# Patient Record
Sex: Male | Born: 1935 | Race: White | Hispanic: No | Marital: Married | Smoking: Former smoker
Health system: Southern US, Community
[De-identification: ages and names within clinical notes are randomized; demographics above are authoritative.]

## PROBLEM LIST (undated history)

## (undated) DIAGNOSIS — I4891 Unspecified atrial fibrillation: Secondary | ICD-10-CM

## (undated) DIAGNOSIS — G500 Trigeminal neuralgia: Secondary | ICD-10-CM

## (undated) DIAGNOSIS — I639 Cerebral infarction, unspecified: Secondary | ICD-10-CM

## (undated) DIAGNOSIS — F0390 Unspecified dementia without behavioral disturbance: Secondary | ICD-10-CM

## (undated) HISTORY — PX: CATARACT EXTRACTION, BILATERAL: SHX1313

---

## 1999-12-09 ENCOUNTER — Encounter: Admission: RE | Admit: 1999-12-09 | Discharge: 2000-03-08 | Payer: Self-pay | Admitting: Internal Medicine

## 2000-04-30 ENCOUNTER — Encounter: Admission: RE | Admit: 2000-04-30 | Discharge: 2000-07-29 | Payer: Self-pay | Admitting: Internal Medicine

## 2002-10-30 ENCOUNTER — Encounter: Admission: RE | Admit: 2002-10-30 | Discharge: 2003-01-28 | Payer: Self-pay | Admitting: Internal Medicine

## 2006-08-30 ENCOUNTER — Emergency Department (HOSPITAL_COMMUNITY): Admission: EM | Admit: 2006-08-30 | Discharge: 2006-08-30 | Payer: Self-pay | Admitting: Emergency Medicine

## 2006-09-06 ENCOUNTER — Emergency Department (HOSPITAL_COMMUNITY): Admission: EM | Admit: 2006-09-06 | Discharge: 2006-09-06 | Payer: Self-pay | Admitting: Emergency Medicine

## 2010-08-12 ENCOUNTER — Encounter
Admission: RE | Admit: 2010-08-12 | Discharge: 2010-11-10 | Payer: Self-pay | Source: Home / Self Care | Attending: Internal Medicine | Admitting: Internal Medicine

## 2010-08-15 ENCOUNTER — Encounter: Admission: RE | Admit: 2010-08-15 | Discharge: 2010-08-15 | Payer: Self-pay | Admitting: Internal Medicine

## 2011-05-15 ENCOUNTER — Inpatient Hospital Stay (HOSPITAL_COMMUNITY)
Admission: EM | Admit: 2011-05-15 | Discharge: 2011-05-18 | DRG: 066 | Disposition: A | Discharge status: Home / Self Care | Payer: Medicare Other | Attending: Internal Medicine | Admitting: Internal Medicine

## 2011-05-15 ENCOUNTER — Emergency Department (HOSPITAL_COMMUNITY): Payer: Medicare Other

## 2011-05-15 DIAGNOSIS — I10 Essential (primary) hypertension: Secondary | ICD-10-CM | POA: Diagnosis present

## 2011-05-15 DIAGNOSIS — E119 Type 2 diabetes mellitus without complications: Secondary | ICD-10-CM | POA: Diagnosis present

## 2011-05-15 DIAGNOSIS — I4891 Unspecified atrial fibrillation: Secondary | ICD-10-CM | POA: Diagnosis present

## 2011-05-15 DIAGNOSIS — Z79899 Other long term (current) drug therapy: Secondary | ICD-10-CM

## 2011-05-15 DIAGNOSIS — Z7901 Long term (current) use of anticoagulants: Secondary | ICD-10-CM

## 2011-05-15 DIAGNOSIS — I6350 Cerebral infarction due to unspecified occlusion or stenosis of unspecified cerebral artery: Principal | ICD-10-CM | POA: Diagnosis present

## 2011-05-15 DIAGNOSIS — F0390 Unspecified dementia without behavioral disturbance: Secondary | ICD-10-CM | POA: Diagnosis present

## 2011-05-15 DIAGNOSIS — G8310 Monoplegia of lower limb affecting unspecified side: Secondary | ICD-10-CM | POA: Diagnosis present

## 2011-05-15 DIAGNOSIS — E785 Hyperlipidemia, unspecified: Secondary | ICD-10-CM | POA: Diagnosis present

## 2011-05-15 DIAGNOSIS — R791 Abnormal coagulation profile: Secondary | ICD-10-CM | POA: Diagnosis present

## 2011-05-15 DIAGNOSIS — M1990 Unspecified osteoarthritis, unspecified site: Secondary | ICD-10-CM | POA: Diagnosis present

## 2011-05-15 DIAGNOSIS — R209 Unspecified disturbances of skin sensation: Secondary | ICD-10-CM | POA: Diagnosis present

## 2011-05-15 LAB — POCT I-STAT, CHEM 8
BUN: 35 mg/dL — ABNORMAL HIGH (ref 6–23)
Chloride: 104 mEq/L (ref 96–112)
HCT: 37 % — ABNORMAL LOW (ref 39.0–52.0)
Potassium: 4 mEq/L (ref 3.5–5.1)

## 2011-05-15 LAB — CBC
HCT: 34.2 % — ABNORMAL LOW (ref 39.0–52.0)
Hemoglobin: 11.5 g/dL — ABNORMAL LOW (ref 13.0–17.0)
MCV: 93.2 fL (ref 78.0–100.0)
RBC: 3.67 MIL/uL — ABNORMAL LOW (ref 4.22–5.81)
WBC: 5.4 10*3/uL (ref 4.0–10.5)

## 2011-05-15 LAB — DIFFERENTIAL
Lymphocytes Relative: 28 % (ref 12–46)
Lymphs Abs: 1.5 10*3/uL (ref 0.7–4.0)
Monocytes Relative: 10 % (ref 3–12)
Neutrophils Relative %: 54 % (ref 43–77)

## 2011-05-15 LAB — APTT: aPTT: 34 seconds (ref 24–37)

## 2011-05-15 LAB — PROTIME-INR: INR: 1.76 — ABNORMAL HIGH (ref 0.00–1.49)

## 2011-05-16 ENCOUNTER — Inpatient Hospital Stay (HOSPITAL_COMMUNITY): Payer: Medicare Other

## 2011-05-16 LAB — COMPREHENSIVE METABOLIC PANEL
Albumin: 3.2 g/dL — ABNORMAL LOW (ref 3.5–5.2)
BUN: 33 mg/dL — ABNORMAL HIGH (ref 6–23)
Calcium: 9 mg/dL (ref 8.4–10.5)
Chloride: 102 mEq/L (ref 96–112)
Creatinine, Ser: 1.29 mg/dL (ref 0.50–1.35)
Total Bilirubin: 0.3 mg/dL (ref 0.3–1.2)
Total Protein: 6.4 g/dL (ref 6.0–8.3)

## 2011-05-16 LAB — CK TOTAL AND CKMB (NOT AT ARMC)
CK, MB: 3.6 ng/mL (ref 0.3–4.0)
Relative Index: INVALID (ref 0.0–2.5)
Total CK: 68 U/L (ref 7–232)

## 2011-05-16 LAB — GLUCOSE, CAPILLARY
Glucose-Capillary: 64 mg/dL — ABNORMAL LOW (ref 70–99)
Glucose-Capillary: 220 mg/dL — ABNORMAL HIGH (ref 70–99)
Glucose-Capillary: 76 mg/dL (ref 70–99)

## 2011-05-16 LAB — PROTIME-INR: INR: 1.86 — ABNORMAL HIGH (ref 0.00–1.49)

## 2011-05-17 DIAGNOSIS — G459 Transient cerebral ischemic attack, unspecified: Secondary | ICD-10-CM

## 2011-05-17 LAB — HEPARIN LEVEL (UNFRACTIONATED): Heparin Unfractionated: 0.44 IU/mL (ref 0.30–0.70)

## 2011-05-17 LAB — PROTIME-INR: Prothrombin Time: 22.7 seconds — ABNORMAL HIGH (ref 11.6–15.2)

## 2011-05-17 LAB — GLUCOSE, CAPILLARY
Glucose-Capillary: 87 mg/dL (ref 70–99)
Glucose-Capillary: 133 mg/dL — ABNORMAL HIGH (ref 70–99)

## 2011-05-17 LAB — CBC
Hemoglobin: 11.2 g/dL — ABNORMAL LOW (ref 13.0–17.0)
MCH: 31.5 pg (ref 26.0–34.0)
RBC: 3.56 MIL/uL — ABNORMAL LOW (ref 4.22–5.81)

## 2011-05-17 NOTE — H&P (Signed)
Martin Mueller, Martin Mueller                   ACCOUNT NO.:  [id number]  MEDICAL RECORD NO.:  [id number]  LOCATION:  3702                         FACILITY:  [facility]  PHYSICIAN:  Geoffry Paradise, M.D.  DATE OF BIRTH:  [birth year]  DATE OF ADMISSION:  05/16/2011 DATE OF DISCHARGE:                             HISTORY & PHYSICAL   CHIEF COMPLAINT:  Right-sided numbness, tingling, and questionable right lower extremity weakness.  HISTORY OF PRESENT ILLNESS:  Martin Mueller is an 77 year old gentleman well- known to myself with a history of chronic atrial fibrillation on Coumadin, diabetes mellitus type 2 on oral agents, hyperlipidemia, hypertension, and memory loss presenting at this time with onset of right-sided symptoms.  He evidently had naps through the early evening hours and when he awakens, told the family that he was having difficulty with the right side of his body to include his right thorax, right arm, and right lower extremity.  This was primarily paresthesias, numbness, and tingling, but also did include some clumsiness and questionable weakness involving the right lower extremity.  He was having some difficulty with his gait and brought to the emergency room where most of this gradually cleared with some questionable residual right lower extremity symptoms.  He was seen initially by the code stroking who declined admission, left the consult, and he was admitted to our service for further management.  Initial CT scan of the brain was negative for acute bleed or events and vital signs were stable.  At the time of my assessment this morning, he is almost symptom-free, a bit more confused now that he is in the hospital.  He denies any chest pain or shortness of breath.  Denies any headache or nausea.  Denies any fever, chills, or sweats.  PAST MEDICAL HISTORY:  The patient is intolerant to Macrobid.  MEDICATIONS: 1. Coumadin 5 mg one daily. 2. Digoxin 0.25 one-half daily. 3. Glucotrol XL 5  mg b.i.d. 4. Multivitamin one daily. 5. Simvastatin 40 mg one-half tablet daily. 6. Namenda 10 mg p.o. b.i.d. 7. Aricept 10 mg daily. 8. Artificial tears p.r.n.  PAST MEDICAL HISTORY: 1. Chronic atrial fibrillation. 2. Benign prostatic hypertrophy. 3. Diabetes mellitus, type 2. 4. Hematuria, microscopic. 5. Hyperlipidemia. 6. Essential hypertension. 7. Osteoarthritis. 8. Pancreatic cyst by CT in 2002, 8 mm renal cyst 3 cm by CT in 2002. 9. Memory loss MMSE 21/30 in August 2010; MMSE 22/30 in April 2010.  SURGICAL ILLNESSES:  Transurethral resection of the prostate in December 1995 and vasectomy in 1970.  FAMILY HISTORY:  Noncontributory.  SOCIAL HISTORY:  He is married, has three children, does not smoke.  He is retired.  PHYSICAL EXAM:  VITAL SIGNS:  Temperature was 98, blood pressure 110/70, pulse was 70 and regular, respiratory rate 18, O2 saturation 94%. GENERAL:  The patient was awake, alert, good facial symmetry, a bit confused but clearly knows that he is in the hospital, clearly knows me, a bit confused on timing and circumstances. HEENT:  Anicteric.  Visual fields intact.  No gaze preference. Oropharynx benign.  Tongue midline. NECK:  Supple.  No JVD or bruits. LUNGS:  Clear. CARDIOVASCULAR:  Irregularly irregular rhythm 70s, 2/6 systolic  ejection murmur. BREASTS:  Negative. ABDOMEN:  Soft and nontender.  No hepatosplenomegaly. EXTREMITIES:  No cyanosis, clubbing, or edema.  Intact distal pulses. Joints normal. NEUROLOGIC:  High cortical functioning intact.  Remote recall diminished.  Cranial nerves intact.  Strength is 5/5 throughout. Sensation is grossly intact.  ASSESSMENT: 1. Right-sided sensory symptoms questionable, right leg weakness,     unclear left brain transient ischemic attack versus cerebrovascular     accident. 2. Chronic atrial fibrillation, on Coumadin, subtherapeutic, we will     require heparin bridge while we adjust it. 3. Memory loss,  on Namenda and Aricept. 4. Diabetes mellitus, type 2, controlled. 5. Hyperlipidemia. 6. Essential hypertension.  PLAN:  The patient will be admitted pending her carotid Dopplers, echo, and MRI.  Coumadin will be readjusted and aspirin added as well.          ______________________________ Geoffry Paradise, M.D.     RA/MEDQ  D:  05/16/2011  T:  05/17/2011  Job:  [id number]  Electronically Signed by Geoffry Paradise M.D. on 05/17/2011 04:20:52 PM

## 2011-05-18 LAB — CBC
HCT: 34.8 % — ABNORMAL LOW (ref 39.0–52.0)
Hemoglobin: 11.8 g/dL — ABNORMAL LOW (ref 13.0–17.0)
MCH: 31.4 pg (ref 26.0–34.0)
MCHC: 33.9 g/dL (ref 30.0–36.0)
MCV: 92.6 fL (ref 78.0–100.0)
RBC: 3.76 MIL/uL — ABNORMAL LOW (ref 4.22–5.81)

## 2011-05-18 LAB — LIPID PANEL
Cholesterol: 115 mg/dL (ref 0–200)
Triglycerides: 49 mg/dL (ref <150)
VLDL: 10 mg/dL (ref 0–40)

## 2011-05-18 LAB — CARDIAC PANEL(CRET KIN+CKTOT+MB+TROPI)
CK, MB: 3.6 ng/mL (ref 0.3–4.0)
Relative Index: INVALID (ref 0.0–2.5)
Total CK: 72 U/L (ref 7–232)
Troponin I: <0.3 ng/mL (ref <0.30)

## 2011-05-24 NOTE — Discharge Summary (Signed)
Martin Mueller, Martin Mueller                   ACCOUNT NO.:  [id number]  MEDICAL RECORD NO.:  [id number]  LOCATION:  3702                         FACILITY:  [facility]  PHYSICIAN:  Geoffry Paradise, M.D.  DATE OF BIRTH:  [birth year]  DATE OF ADMISSION:  05/15/2011 DATE OF DISCHARGE:  05/18/2011                              DISCHARGE SUMMARY   DIAGNOSES AT THE TIME OF DISCHARGE: 1. Acute lacunar infarct/cerebrovascular accident left corona radiata     with right-sided sensory symptoms and right lower extremity     paresis. 2. Chronic atrial fibrillation on Coumadin subtherapeutic. 3. Diabetes mellitus type 2 controlled with oral agents. 4. Hyperlipidemia. 5. Memory loss. 6. Osteoarthritis.  HISTORY OF PRESENT ILLNESS:  Mr. Laspina is a very pleasant 77 year old gentleman well-known to myself with chronic AFib on Coumadin, diabetes mellitus type 2 on oral agents, hyperlipidemia and memory loss presenting at this time with sudden onset of new right-sided sensory symptoms and a clumsy right lower extremity.  He evidently awoke from a nap this way and was seen in the emergency room where Code Stroke was called but no intervention planned.  His INR was subtherapeutic at that time and he was referred to the Primary Service for admission after Neurology declined admission and relinquished himself to just consultative status only.  For details see the dictated history and physical exam May 16, 2011.  DATA:  MRI acute lacunar infarct left lateral __________ white matter, no mass effect or hemorrhage, otherwise, no acute disease.  CT scan of the brain atrophy, no acute abnormalities.  Carotid Dopplers no internal carotid disease, vertebral flow antegrade.  CBC hemoglobin 11.5, hematocrit 34.2, white blood cell count is 5.4, platelet count is 140,000.  Chemistry sodium 137, potassium 4.1, chloride 102, CO2 26, glucose 208, BUN 33, creatinine 1.2.  Liver function testing bilirubin 0.3, alk phos 41, SGOT 18,  SGPT 14, protein is 6.4, albumin 3.2, calcium 9, CK 68, MB 3.6, troponin less than 0.3.  May 18, 2011, INR is 1.57. May 17, 2011, INR is 1.96.  May 16, 2011, INR is 1.86.  Pending at this time is echocardiogram.  HOSPITAL COURSE:  The patient was admitted, hydrated.  OT/PT consulted. Symptoms gradually resolved.  He is steady gait wise with a cane and to this extent this was the status as well upon admission.  He was placed on a heparin bridge with Coumadin, aspirin added to the regimen as well. It remains unclear as to whether this was a small embolic event or purely a cortical occlusive event but both covered well with a combination of Coumadin and aspirin.  He is clearly back to baseline with minimal confusion but awake, alert, acknowledges myself at [facility] and circumstances.  He did had some confusion earlier on in the course.  He has been stable from a cardiopulmonary standpoint with stable blood pressure and heart rate in the 80s to 90s.  His CBGs have been well-controlled as well.  The patient is discharged in improved and stable condition.  DISCHARGE MEDICATIONS: 1. Aspirin 325 daily. 2. Digoxin 0.25 daily. 3. Donepezil 10 mg daily. 4. Glipizide 5 b.i.d. 5. Multivitamin 1 daily. 6. Namenda 10  b.i.d. 7. Warfarin, this has been changed to 1 mg alternating with 2 mg     daily. 8. Simvastatin 20 mg daily.  He will follow up this week with Dr. Lanell Matar office for a pro time and followup.  I have discussed all circumstances with wife, Reita Cliche as well. He is on a carb- modified diet and safety has been reassessed.          ______________________________ Geoffry Paradise, M.D.     RA/MEDQ  D:  05/18/2011  T:  05/18/2011  Job:  [id number]  Electronically Signed by Geoffry Paradise M.D. on 05/24/2011 10:00:01 PM

## 2011-06-10 NOTE — Consult Note (Signed)
Martin Mueller, Martin Mueller                   ACCOUNT NO.:  [id number]  MEDICAL RECORD NO.:  [id number]  LOCATION:  [facility]                         FACILITY:  [facility]  PHYSICIAN:  Joycelyn Schmid, MD   DATE OF BIRTH:  1921/07/08  DATE OF CONSULTATION:  05/15/2011 DATE OF DISCHARGE:                                CONSULTATION   TIME:  11:30 p.m.  CHIEF COMPLAINT:  Right-sided right arm and leg numbness and weakness.  HISTORY OF PRESENT ILLNESS:  This is an 77 year old right-handed male with history of diabetes, atrial fibrillation, dementia who was in normal state of health, had been sleeping on and off throughout the day, last went to sleep at 6 o'clock p.m., when he woke up at around 8 o'clock p.m., he went to take a shower and noticed right leg heaviness and weakness.  Within half an hour, his right arm felt numb.  The patient's wife was with him and called 911, the patient was brought to [facility] for code stroke evaluation.  Upon arrival, the patient's symptoms were rapidly improving.  His weakness had resolved. He only had minimal subjective paresthesias in the right leg, therefore, code stroke was cancelled.  Also, the patient was later found to be on Coumadin.  Presently, the patient feels that his weakness has significantly improved.  He still feels some intermittent numbness in his right leg.  PAST MEDICAL HISTORY: 1. Diabetes. 2. Atrial fibrillation. 3. Dementia.  MEDICATIONS:  Coumadin, digoxin, donepezil, Namenda, simvastatin, glipizide.  ALLERGIES:  No known drug allergies.  FAMILY HISTORY:  None.  SOCIAL HISTORY:  He lives with his wife.  He quit tobacco use years ago. No alcohol use.  No illicit drug use.  REVIEW OF SYSTEMS:  As per HPI.  Denies recent fevers, chills, chest pain, shortness of breath, muscle pain, nausea, vomiting, abdominal pain.  Full 14-system review is otherwise negative.  PHYSICAL EXAMINATION:  VITAL SIGNS:  Blood pressure 133/59,  heart rate 74 and irregular, respirations 18, O2 sat 99% on 2 liters nasal cannula. GENERAL:  Awake, alert, language is fluent, comprehension intact.  He is somewhat confused about the date and time of day.  He has good insight into his dementia and memory difficulties.  No aphasia. NEUROLOGIC:  Cranial nerve examination:  Pupils reactive from 2-1 mm. Visual fields full to confrontation.  Extraocular muscles intact. Facial sensation and strength are symmetric.  Uvula is midline. Shoulder shrug is symmetric.  Tongue is midline. Motor examination:  Normal bulk and tone, 5/5 strength in the bilateral upper and lower extremities. Sensory examination is intact to light touch and pinprick.  Absent vibration sensation in his left toe, ankle, and left knee.  Reflexes 1+ at the upper extremities and knees, absent at the ankles.  Downgoing toes.  Finger-nose-finger smooth and symmetric. CARDIOVASCULAR:  Irregularly irregular.  No murmurs.  No carotid bruits.  LABORATORY TESTING:  White count 5.4, platelets 140.  CBG 251.  Sodium 138, potassium 4, BUN 35, creatinine 1.4.  CT scan of the head which I reviewed shows moderate biparietal and bitemporal atrophy, mild chronic small vessel ischemic disease.  IMPRESSION:  This is an 77 year old male with  diabetes, atrial fibrillation, and dementia with right arm and leg numbness and right leg weakness.  Symptoms are rapidly improving and essentially resolved. Differential diagnosis transient ischemic attack versus small left brain subcortical ischemic infarction.  RECOMMENDATIONS: 1. Followup lab testing, continue Coumadin. 2. MRI of the brain. 3. Consider carotid ultrasound.  Although given the patient's age and     dementia, he likely is a poor surgical candidate. 4. Diabetes control. 5. Permissive hypertension for 24 hours and then gradually reduce     blood pressure to normotensive over the next several days. 6. Check fasting lipid profile. 7.  PT evaluation. 8. N.p.o. until swallow evaluation. 9. I reviewed my findings with the patient, his wife, and his son who     were at the bedside.     Joycelyn Schmid, MD     VP/MEDQ  D:  05/15/2011  T:  05/16/2011  Job:  [id number]  Electronically Signed by Joycelyn Schmid  on 06/10/2011 10:21:05 PM

## 2011-08-31 ENCOUNTER — Encounter (HOSPITAL_COMMUNITY): Payer: Self-pay | Admitting: Radiology

## 2011-08-31 ENCOUNTER — Emergency Department (HOSPITAL_COMMUNITY)
Admission: EM | Admit: 2011-08-31 | Discharge: 2011-08-31 | Disposition: A | Discharge status: Home / Self Care | Payer: Medicare Other | Attending: Emergency Medicine | Admitting: Emergency Medicine

## 2011-08-31 ENCOUNTER — Emergency Department (HOSPITAL_COMMUNITY): Payer: Medicare Other

## 2011-08-31 DIAGNOSIS — I4891 Unspecified atrial fibrillation: Secondary | ICD-10-CM | POA: Insufficient documentation

## 2011-08-31 DIAGNOSIS — F0390 Unspecified dementia without behavioral disturbance: Secondary | ICD-10-CM | POA: Insufficient documentation

## 2011-08-31 DIAGNOSIS — E119 Type 2 diabetes mellitus without complications: Secondary | ICD-10-CM | POA: Insufficient documentation

## 2011-08-31 DIAGNOSIS — Y92009 Unspecified place in unspecified non-institutional (private) residence as the place of occurrence of the external cause: Secondary | ICD-10-CM | POA: Insufficient documentation

## 2011-08-31 DIAGNOSIS — Z8679 Personal history of other diseases of the circulatory system: Secondary | ICD-10-CM | POA: Insufficient documentation

## 2011-08-31 DIAGNOSIS — W010XXA Fall on same level from slipping, tripping and stumbling without subsequent striking against object, initial encounter: Secondary | ICD-10-CM | POA: Insufficient documentation

## 2011-08-31 DIAGNOSIS — S0180XA Unspecified open wound of other part of head, initial encounter: Secondary | ICD-10-CM | POA: Insufficient documentation

## 2011-08-31 DIAGNOSIS — IMO0002 Reserved for concepts with insufficient information to code with codable children: Secondary | ICD-10-CM | POA: Insufficient documentation

## 2011-08-31 HISTORY — DX: Cerebral infarction, unspecified: I63.9

## 2011-08-31 HISTORY — DX: Unspecified atrial fibrillation: I48.91

## 2011-08-31 HISTORY — DX: Unspecified dementia, unspecified severity, without behavioral disturbance, psychotic disturbance, mood disturbance, and anxiety: F03.90

## 2011-08-31 LAB — PROTIME-INR
INR: 2.53 — ABNORMAL HIGH (ref 0.00–1.49)
Prothrombin Time: 27.7 seconds — ABNORMAL HIGH (ref 11.6–15.2)

## 2011-08-31 LAB — BASIC METABOLIC PANEL
CO2: 32 mEq/L (ref 19–32)
Chloride: 102 mEq/L (ref 96–112)
GFR calc Af Amer: 62 mL/min — ABNORMAL LOW (ref >90)
Potassium: 4.6 mEq/L (ref 3.5–5.1)

## 2011-08-31 LAB — DIFFERENTIAL
Basophils Absolute: 0 10*3/uL (ref 0.0–0.1)
Basophils Relative: 1 % (ref 0–1)
Lymphocytes Relative: 22 % (ref 12–46)
Monocytes Absolute: 0.6 10*3/uL (ref 0.1–1.0)
Monocytes Relative: 10 % (ref 3–12)
Neutro Abs: 3.9 10*3/uL (ref 1.7–7.7)
Neutrophils Relative %: 64 % (ref 43–77)

## 2011-08-31 LAB — CBC
HCT: 37.5 % — ABNORMAL LOW (ref 39.0–52.0)
Hemoglobin: 12.5 g/dL — ABNORMAL LOW (ref 13.0–17.0)
MCH: 31.3 pg (ref 26.0–34.0)
MCHC: 33.3 g/dL (ref 30.0–36.0)
RBC: 3.99 MIL/uL — ABNORMAL LOW (ref 4.22–5.81)

## 2011-08-31 LAB — DIGOXIN LEVEL: Digoxin Level: 1.2 ng/mL (ref 0.8–2.0)

## 2013-05-03 ENCOUNTER — Emergency Department (HOSPITAL_COMMUNITY): Payer: Medicare Other

## 2013-05-03 ENCOUNTER — Emergency Department (HOSPITAL_COMMUNITY)
Admission: EM | Admit: 2013-05-03 | Discharge: 2013-05-03 | Disposition: A | Discharge status: Home / Self Care | Payer: Medicare Other | Attending: Emergency Medicine | Admitting: Emergency Medicine

## 2013-05-03 ENCOUNTER — Encounter (HOSPITAL_COMMUNITY): Payer: Self-pay | Admitting: Emergency Medicine

## 2013-05-03 DIAGNOSIS — E119 Type 2 diabetes mellitus without complications: Secondary | ICD-10-CM | POA: Insufficient documentation

## 2013-05-03 DIAGNOSIS — R5383 Other fatigue: Secondary | ICD-10-CM | POA: Insufficient documentation

## 2013-05-03 DIAGNOSIS — F0390 Unspecified dementia without behavioral disturbance: Secondary | ICD-10-CM | POA: Insufficient documentation

## 2013-05-03 DIAGNOSIS — R5381 Other malaise: Secondary | ICD-10-CM | POA: Insufficient documentation

## 2013-05-03 DIAGNOSIS — Z7901 Long term (current) use of anticoagulants: Secondary | ICD-10-CM | POA: Insufficient documentation

## 2013-05-03 DIAGNOSIS — R05 Cough: Secondary | ICD-10-CM | POA: Insufficient documentation

## 2013-05-03 DIAGNOSIS — Z8673 Personal history of transient ischemic attack (TIA), and cerebral infarction without residual deficits: Secondary | ICD-10-CM | POA: Insufficient documentation

## 2013-05-03 DIAGNOSIS — Z8669 Personal history of other diseases of the nervous system and sense organs: Secondary | ICD-10-CM | POA: Insufficient documentation

## 2013-05-03 DIAGNOSIS — I4891 Unspecified atrial fibrillation: Secondary | ICD-10-CM | POA: Insufficient documentation

## 2013-05-03 DIAGNOSIS — R531 Weakness: Secondary | ICD-10-CM

## 2013-05-03 DIAGNOSIS — R059 Cough, unspecified: Secondary | ICD-10-CM | POA: Insufficient documentation

## 2013-05-03 DIAGNOSIS — Z79899 Other long term (current) drug therapy: Secondary | ICD-10-CM | POA: Insufficient documentation

## 2013-05-03 HISTORY — DX: Trigeminal neuralgia: G50.0

## 2013-05-03 LAB — COMPREHENSIVE METABOLIC PANEL
Albumin: 3 g/dL — ABNORMAL LOW (ref 3.5–5.2)
Alkaline Phosphatase: 59 U/L (ref 39–117)
BUN: 14 mg/dL (ref 6–23)
CO2: 27 mEq/L (ref 19–32)
Chloride: 99 mEq/L (ref 96–112)
GFR calc Af Amer: 62 mL/min — ABNORMAL LOW (ref >90)
GFR calc non Af Amer: 54 mL/min — ABNORMAL LOW (ref >90)
Glucose, Bld: 101 mg/dL — ABNORMAL HIGH (ref 70–99)
Potassium: 4 mEq/L (ref 3.5–5.1)
Total Bilirubin: 0.4 mg/dL (ref 0.3–1.2)

## 2013-05-03 LAB — CBC WITH DIFFERENTIAL/PLATELET
HCT: 33.1 % — ABNORMAL LOW (ref 39.0–52.0)
Hemoglobin: 10.8 g/dL — ABNORMAL LOW (ref 13.0–17.0)
Lymphocytes Relative: 14 % (ref 12–46)
Lymphs Abs: 0.8 10*3/uL (ref 0.7–4.0)
Monocytes Absolute: 0.5 10*3/uL (ref 0.1–1.0)
Monocytes Relative: 9 % (ref 3–12)
Neutro Abs: 4.2 10*3/uL (ref 1.7–7.7)
Neutrophils Relative %: 76 % (ref 43–77)
RBC: 4.15 MIL/uL — ABNORMAL LOW (ref 4.22–5.81)

## 2013-05-03 LAB — URINALYSIS, ROUTINE W REFLEX MICROSCOPIC
Bilirubin Urine: NEGATIVE
Ketones, ur: NEGATIVE mg/dL
Nitrite: NEGATIVE
Protein, ur: NEGATIVE mg/dL
pH: 6.5 (ref 5.0–8.0)

## 2013-05-03 LAB — DIGOXIN LEVEL: Digoxin Level: 0.7 ng/mL — ABNORMAL LOW (ref 0.8–2.0)

## 2013-05-03 LAB — URINE MICROSCOPIC-ADD ON

## 2013-05-03 LAB — TROPONIN I: Troponin I: <0.3 ng/mL (ref <0.30)

## 2013-05-03 MED ORDER — SODIUM CHLORIDE 0.9 % IV BOLUS (SEPSIS)
500.0000 mL | Freq: Once | INTRAVENOUS | Status: AC
  Administered 2013-05-03: 500 mL via INTRAVENOUS

## 2013-05-03 NOTE — ED Notes (Signed)
Returned from ct 

## 2013-05-03 NOTE — ED Notes (Signed)
Attempted to in and out cath pt, unable to pass catheter. Dr. Ranae Palms made aware.

## 2013-05-03 NOTE — Progress Notes (Signed)
Spoke with Baxter Hire, [facility] Coordinator via text after wife informed Amy ED CM that [facility] is her [facility] for home health agency Pending orders from EDP, Freida Busman.  Kristen replied at 1701 that text received and pt to be followed

## 2013-05-03 NOTE — Progress Notes (Signed)
05/03/2013 1655pm A. German Manke RNCM [facility] spoke to patient's wife at bedside who confirmed that she would like [facility] to follow her husband at home.  Explained to patient's wife that her husband would receive nursing services, physical and occupational therapy, a Agricultural engineer and a Child psychotherapist at home. Patient's wife veralized understanding and is thankful for services.

## 2013-05-03 NOTE — ED Notes (Signed)
Informed by secretary that pts PCP will be admitting him, Admitting MD is aware that pt is in ED and will be to see pt when he can. Pt to remain in ED until seen by Admitting MD. Pt and family notified.

## 2013-05-03 NOTE — ED Notes (Signed)
Pt Unable to void at this time.

## 2013-05-03 NOTE — Progress Notes (Signed)
ED CM consulted with EDP, [place] about pt Pending clinical review by EDP  CM spoke with spouse and reviewed in details medicare guidelines, home health [facility]) (length of stay in home, types of [facility] staff available, coverage, primary caregiver, up to 24 hrs before services may be started) and Private duty nursing (PDN-coverage, length of stay in the home types of staff available) CM provided family with a list of [place] home health agencies Wife reports no interest in PDN because pt does not have medicaid.   Choices offered   HOME HEALTH AGENCIES SERVING [place]   Agencies that are Medicare-Certified and are affiliated with The [facility] Agency  Telephone Number Address  [facility]   The [facility] has ownership interest in this company; however, you are under no obligation to use this agency. [phone] or  [phone] [address] [place], [place] [address] [email or web address]   Agencies that are Medicare-Certified and are not affiliated with The [facility] Agency Telephone Number Address  [facility] [phone] Fax [phone] [address] [place], [place]  [address] [email or web address]  [facility] [phone] or [phone] Fax [phone] [address] [place], [place] [address] [email or web address]  [facility] [phone] Fax [phone] [address] [place], [place] [address] [email or web address]  [facility] [phone] Fax [phone] [address] [place], [place]  [address] [email or web address]  [facility] [phone] Fax [phone] [address] [place], [place] [address] [email or web address]  [facility]  Services of [facility] [phone] [address] [place], [place] [address] [email or web address]  [facility] [phone]  [address] [place], [place] [address] [email or web address]  [facility] [phone] or [phone] Fax number [phone] [address] [place], [place]  [address] [email or web address]  [facility] [phone] Fax [phone] [address] [place], [place]  [address]  [facility]  [phone] Fax [phone] [address] [place], [place] [address] [email or web address]

## 2013-05-03 NOTE — ED Notes (Signed)
Pt wife states gradually over the past week pt has become weaker and now has to have assistance standing up. Pt has history of Afib and trigeminal neurologia.

## 2013-05-03 NOTE — Consult Note (Signed)
PCP:   Minda Meo, MD   Chief Complaint:  3-4 days of weakness  HPI: Martin Mueller is been a long-standing patient been in a state of decline for several months most notable for this last several days. He has recently had a thorough GI evaluation because of poor by mouth intake and ultimately responded to a pure proton pump inhibitor. CT scan did demonstrate some gastric thickening but GI felt as though this was acid peptic and he was too high risk for intervention. He said several days of weakness to the extent that his wife is having difficulty handling him with poor ambulation. This having been said he has been eating and drinking. He's had no nausea vomiting or change in bowels. He's had no chest pain or shortness of breath. He's had no change in speech or motor function. He's had no urinary symptoms and denies any fevers. At this point he is awake alert certainly confused regarding circumstances but this is his baseline. His have a thorough emergency room evaluation with very little found and I been called to help make a disposition.  RPast Medical History: Past Medical History  Diagnosis Date  . Diabetes mellitus   . A-fib   . Dementia   . CVA (cerebral vascular accident)   . Trigeminal neuralgia    Past Surgical History  Procedure Laterality Date  . Cataract extraction, bilateral      Medications: Prior to Admission medications   Medication Sig Start Date End Date Taking? Authorizing Provider  digoxin (LANOXIN) 0.25 MG tablet Take 0.25 mg by mouth daily.   Yes Historical Provider, MD  donepezil (ARICEPT) 10 MG tablet Take 10 mg by mouth at bedtime as needed.   Yes Historical Provider, MD  glipiZIDE (GLUCOTROL) 5 MG tablet Take 5 mg by mouth 2 (two) times daily before a meal.   Yes Historical Provider, MD  memantine (NAMENDA) 10 MG tablet Take 10 mg by mouth 2 (two) times daily.   Yes Historical Provider, MD  pantoprazole (PROTONIX) 40 MG tablet Take 40 mg by mouth 2 (two) times  daily.   Yes Historical Provider, MD  simvastatin (ZOCOR) 20 MG tablet Take 20 mg by mouth every evening.   Yes Historical Provider, MD  warfarin (COUMADIN) 5 MG tablet Take 5 mg by mouth daily. Takes 6 days per week, not on wednesdays   Yes Historical Provider, MD    Allergies:  No Known Allergies  Social History:  reports that he has never smoked. He has never used smokeless tobacco. He reports that he does not drink alcohol or use illicit drugs.  Family History: History reviewed. No pertinent family history.  Physical Exam: Filed Vitals:   05/03/13 1300 05/03/13 1400 05/03/13 1530 05/03/13 1730  BP: 129/61 110/55 115/85 117/59  Pulse: 77 58 74   Temp:      TempSrc:      Resp: 19 19 18 26   Height:      Weight:      SpO2: 98% 96% 98%    General appearance: alert and cooperative Head: Normocephalic, without obvious abnormality, atraumatic Eyes: conjunctivae/corneas clear. PERRL, EOM's intact.  Nose: Nares normal. Septum midline. Mucosa normal. No drainage or sinus tenderness. Throat: lips, mucosa, and tongue normal; teeth and gums normal Neck: no adenopathy, no carotid bruit, no JVD and thyroid not enlarged, symmetric, no tenderness/mass/nodules Resp: clear to auscultation bilaterally Cardio: regular rate and rhythm GI: soft, non-tender; bowel sounds normal; no masses,  no organomegaly Extremities: extremities normal, atraumatic,  no cyanosis or edema Pulses: 2+ and symmetric Lymph nodes: Cervical adenopathy: no cervical lymphadenopathy Neurologic: Alert and oriented X 3, normal strength and tone. Normal symmetric reflexes.     Labs on Admission:   Recent Labs  05/03/13 1200  NA 135  K 4.0  CL 99  CO2 27  GLUCOSE 101*  BUN 14  CREATININE 1.16  CALCIUM 9.1    Recent Labs  05/03/13 1200  AST 17  ALT 10  ALKPHOS 59  BILITOT 0.4  PROT 7.0  ALBUMIN 3.0*   No results found for this basename: LIPASE, AMYLASE,  in the last 72 hours  Recent Labs   05/03/13 1200  WBC 5.5  NEUTROABS 4.2  HGB 10.8*  HCT 33.1*  MCV 79.8  PLT 187    Recent Labs  05/03/13 1200  TROPONINI <0.30   No results found for this basename: TSH, T4TOTAL, FREET3, T3FREE, THYROIDAB,  in the last 72 hours No results found for this basename: VITAMINB12, FOLATE, FERRITIN, TIBC, IRON, RETICCTPCT,  in the last 72 hours  Radiological Exams on Admission: Ct Head Wo Contrast  05/03/2013   *RADIOLOGY REPORT*  Clinical Data: Weakness for past week. Dementia.  CT HEAD WITHOUT CONTRAST  Technique:  Contiguous axial images were obtained from the base of the skull through the vertex without contrast.  Comparison: 08/31/2011.  Findings: No intracranial hemorrhage.  Small vessel disease type changes without CT evidence of large acute infarct.  Atrophy with ventricular prominence unchanged and may be related to central atrophy.  Difficult to completely exclude mild component hydrocephalus.  No intracranial mass lesion detected on this unenhanced exam.  Vascular calcifications.  Mastoid air cells, middle ear cavities and visualized paranasal sinuses are clear.  Orbital structures unremarkable.  IMPRESSION: No acute abnormality.  Please see above.   Original Report Authenticated By: Lacy Duverney, M.D.   Dg Chest Port 1 View  05/03/2013   *RADIOLOGY REPORT*  Clinical Data: Cough and weakness.  PORTABLE CHEST - 1 VIEW  Comparison: None.  Findings: Patient is slightly rotated.  Heart size is accentuated by AP semi upright technique.  Probable calcified granuloma in the right upper lobe.  Lungs are hyperinflated with atelectasis or scarring in the medial left lower lobe.  No pleural fluid. Probable bone island in the right second rib.  IMPRESSION: Hyperinflation with scar or atelectasis the medial left lower lobe.   Original Report Authenticated By: Leanna Battles, M.D.   Orders placed during the hospital encounter of 05/03/13  . ED EKG  . ED EKG  . EKG 12-LEAD  . EKG 12-LEAD     Assessment/Plan #1 failure to thrive because of advanced age, dementia with no reversible etiology certainly no evidence of infection or correctable intervention.  #2 chronic atrial fibrillation rate well controlled discussed with wife stopping Coumadin as he is extremely high fall risk and CNS bleed risk at 79 years of age and has failing state she understands the risk of stroke but this is a safer course  #3 gastritis and esophagitis responded nicely to proton pump inhibitors  #4 dementia likely moderate Alzheimer's  #5 diabetes mellitus type 2 well-controlled no evidence of hypoglycemia  Extended discussion with wife and son regarding end-of-life decline and no ability to change the course of this. Certainly no evidence of reversible CNS process no evidence of reversible underlying infectious disease process. Nutrition and hydration are reasonable. Discussed options including overnight observation while binding placement understanding risk of decompensation from a cognitive standpoint making placement  more difficult. Discussed return home with some help case manager working on this ultimately transitioning to [facility] where they have arty established investigations replacement. At this point wife and son are choosing return home he'll workup placement from home.   Martin Mueller A 05/03/2013, 6:32 PM

## 2013-05-03 NOTE — Progress Notes (Signed)
05/04/2011 1852pm A. Tineka Uriegas RNCM Dr. Jacky Kindle at bedside to discharge patient to home.  Patient's wife will be staying with patient tonite.  Home health orders placed for RN, PT, OT, nurse aide and social worker and to be followed by advanced home care.  Reminded patient's wife that the number for [facility] is on the list of home health agencies we provided her earlier in the day.  Called patient's wife at home at this time to inform her that we spoke to Medora at [facility] and that she will be calling her tomorrow to set up patient sitter.  The number provided to patient's wife for home instead senior care was [phone].  Patient's wife thankful for services.  No futher needs at this time.

## 2013-05-03 NOTE — ED Notes (Signed)
Dr. Jacky Kindle at bedside to eval pt.

## 2013-05-03 NOTE — ED Notes (Signed)
Unable to transfer call from Dr Jacky Kindle to Dr Ranae Palms. Dr Jacky Kindle stated "I don't need to talk with Dr Ranae Palms. Tell him I'm aware of the patient in the ER. Please leave the patient in the ER and I'll get to him sometime tonight". Made Dr Ranae Palms aware.

## 2013-05-03 NOTE — ED Notes (Signed)
[facility] contacted to transport pt home.  

## 2013-05-03 NOTE — ED Notes (Signed)
Unable to void at this time. Urinal at bedside

## 2013-05-03 NOTE — ED Provider Notes (Signed)
History     CSN: [id number]  Arrival date & time 05/03/13  1122   First MD Initiated Contact with Patient 05/03/13 1127      Chief Complaint  Patient presents with  . Weakness    (Consider location/radiation/quality/duration/timing/severity/associated sxs/prior treatment) HPI 79 yo M who lives at home with 24 yo wife presents with 3-4 days of progressive generalized weakness and malaise. Wife states pt is normally able to stand and transfer with assistance but for last few days has been "dead weight." +cough with white sputum. No fever or chills. No SOB, CP, abd pain, N/V/D, lower ext swelling or pain. No focal weakness.  Past Medical History  Diagnosis Date  . Diabetes mellitus   . A-fib   . Dementia   . CVA (cerebral vascular accident)   . Trigeminal neuralgia     History reviewed. No pertinent past surgical history.  History reviewed. No pertinent family history.  History  Substance Use Topics  . Smoking status: Never Smoker   . Smokeless tobacco: Not on file  . Alcohol Use: No      Review of Systems  Constitutional: Positive for activity change and fatigue. Negative for fever and chills.  HENT: Negative for neck pain and neck stiffness.   Respiratory: Positive for cough. Negative for shortness of breath.   Cardiovascular: Negative for chest pain, palpitations and leg swelling.  Gastrointestinal: Negative for nausea, vomiting, abdominal pain, diarrhea and constipation.  Genitourinary: Negative for dysuria, frequency, hematuria and flank pain.  Skin: Negative for pallor, rash and wound.  Neurological: Positive for weakness. Negative for dizziness, syncope, light-headedness, numbness and headaches.  All other systems reviewed and are negative.    Allergies  Review of patient's allergies indicates no known allergies.  Home Medications  No current outpatient prescriptions on file.  There were no vitals taken for this visit.  Physical Exam  Nursing note and  vitals reviewed. Constitutional: He is oriented to person, place, and time. He appears well-developed and well-nourished. No distress.  HENT:  Head: Normocephalic and atraumatic.  Dry MM  Eyes: EOM are normal. Pupils are equal, round, and reactive to light.  Neck: Normal range of motion. Neck supple.  Cardiovascular: Normal rate and regular rhythm.   Pulmonary/Chest: Effort normal and breath sounds normal. No respiratory distress. He has no wheezes. He has no rales. He exhibits no tenderness.  Abdominal: Soft. Bowel sounds are normal. He exhibits no distension and no mass. There is no tenderness. There is no rebound and no guarding.  Musculoskeletal: Normal range of motion. He exhibits no edema and no tenderness.  Neurological: He is alert and oriented to person, place, and time.  5/5 motor in all ext, sensation intact  Skin: Skin is warm and dry. No rash noted. No erythema.  Psychiatric: He has a normal mood and affect. His behavior is normal.    ED Course  Procedures (including critical care time)  Labs Reviewed  CBC WITH DIFFERENTIAL  COMPREHENSIVE METABOLIC PANEL  TROPONIN I  URINALYSIS, ROUTINE W REFLEX MICROSCOPIC   No results found.   No diagnosis found.   Date: 05/03/2013  Rate: 85  Rhythm: atrial fibrillation   QRS Axis: normal  Intervals: normal  ST/T Wave abnormalities: normal  Conduction Disutrbances:none  Narrative Interpretation:   Old EKG Reviewed: unchanged    MDM   Dr Jacky Kindle relayed to secretary that he would see the pt in the ED after he gets out of clinic.  Pt remains stable.  Loren Racer, MD 05/03/13 1558

## 2013-05-03 NOTE — ED Notes (Signed)
Bed:WA08<BR> Expected date:<BR> Expected time:<BR> Means of arrival:<BR> Comments:<BR> ems

## 2013-05-05 NOTE — Progress Notes (Signed)
05/05/2013 2014pm A. Timm Bonenberger RNCM Follow up phone call placed to Mrs. Glaze patient's wife. As per Mrs. Paulsen, [facility] was at their home today and someone else will be out on Monday to see thr patient.  Patient's wife stated that patient is about the same.  Mrs. Morad was thankfu for phone call and thankful for services.

## 2013-11-17 ENCOUNTER — Inpatient Hospital Stay (HOSPITAL_COMMUNITY)
Admission: EM | Admit: 2013-11-17 | Discharge: 2013-11-23 | DRG: 393 | Disposition: E | Payer: Medicare Other | Attending: Internal Medicine | Admitting: Internal Medicine

## 2013-11-17 ENCOUNTER — Other Ambulatory Visit: Payer: Self-pay

## 2013-11-17 ENCOUNTER — Emergency Department (HOSPITAL_COMMUNITY): Payer: Medicare Other

## 2013-11-17 ENCOUNTER — Encounter (HOSPITAL_COMMUNITY): Payer: Self-pay | Admitting: Emergency Medicine

## 2013-11-17 DIAGNOSIS — K559 Vascular disorder of intestine, unspecified: Principal | ICD-10-CM | POA: Diagnosis present

## 2013-11-17 DIAGNOSIS — Z79899 Other long term (current) drug therapy: Secondary | ICD-10-CM

## 2013-11-17 DIAGNOSIS — R109 Unspecified abdominal pain: Secondary | ICD-10-CM | POA: Diagnosis present

## 2013-11-17 DIAGNOSIS — Z66 Do not resuscitate: Secondary | ICD-10-CM | POA: Diagnosis present

## 2013-11-17 DIAGNOSIS — K862 Cyst of pancreas: Secondary | ICD-10-CM | POA: Diagnosis present

## 2013-11-17 DIAGNOSIS — R52 Pain, unspecified: Secondary | ICD-10-CM | POA: Diagnosis present

## 2013-11-17 DIAGNOSIS — Z9849 Cataract extraction status, unspecified eye: Secondary | ICD-10-CM

## 2013-11-17 DIAGNOSIS — G500 Trigeminal neuralgia: Secondary | ICD-10-CM | POA: Diagnosis present

## 2013-11-17 DIAGNOSIS — Z794 Long term (current) use of insulin: Secondary | ICD-10-CM

## 2013-11-17 DIAGNOSIS — E119 Type 2 diabetes mellitus without complications: Secondary | ICD-10-CM | POA: Diagnosis present

## 2013-11-17 DIAGNOSIS — R6520 Severe sepsis without septic shock: Secondary | ICD-10-CM | POA: Diagnosis present

## 2013-11-17 DIAGNOSIS — Z515 Encounter for palliative care: Secondary | ICD-10-CM

## 2013-11-17 DIAGNOSIS — A419 Sepsis, unspecified organism: Secondary | ICD-10-CM | POA: Diagnosis present

## 2013-11-17 DIAGNOSIS — Z8673 Personal history of transient ischemic attack (TIA), and cerebral infarction without residual deficits: Secondary | ICD-10-CM

## 2013-11-17 DIAGNOSIS — I4891 Unspecified atrial fibrillation: Secondary | ICD-10-CM | POA: Diagnosis present

## 2013-11-17 DIAGNOSIS — F0390 Unspecified dementia without behavioral disturbance: Secondary | ICD-10-CM | POA: Diagnosis present

## 2013-11-17 DIAGNOSIS — R000 Tachycardia, unspecified: Secondary | ICD-10-CM | POA: Diagnosis not present

## 2013-11-17 DIAGNOSIS — Z87891 Personal history of nicotine dependence: Secondary | ICD-10-CM

## 2013-11-17 DIAGNOSIS — I7409 Other arterial embolism and thrombosis of abdominal aorta: Secondary | ICD-10-CM | POA: Diagnosis present

## 2013-11-17 HISTORY — DX: Unspecified atrial fibrillation: I48.91

## 2013-11-17 LAB — CBC WITH DIFFERENTIAL/PLATELET
Basophils Absolute: 0 10*3/uL (ref 0.0–0.1)
Basophils Relative: 0 % (ref 0–1)
Eosinophils Absolute: 0.2 10*3/uL (ref 0.0–0.7)
MCHC: 32.3 g/dL (ref 30.0–36.0)
Monocytes Absolute: 0.7 10*3/uL (ref 0.1–1.0)
Neutro Abs: 5.4 10*3/uL (ref 1.7–7.7)
Neutrophils Relative %: 60 % (ref 43–77)
RDW: 14.8 % (ref 11.5–15.5)

## 2013-11-17 LAB — POCT I-STAT, CHEM 8
Calcium, Ion: 1.25 mmol/L (ref 1.13–1.30)
Chloride: 102 mEq/L (ref 96–112)
HCT: 42 % (ref 39.0–52.0)
Hemoglobin: 14.3 g/dL (ref 13.0–17.0)
Potassium: 3.5 mEq/L (ref 3.5–5.1)
TCO2: 28 mmol/L (ref 0–100)

## 2013-11-17 LAB — COMPREHENSIVE METABOLIC PANEL
AST: 25 U/L (ref 0–37)
Albumin: 3.5 g/dL (ref 3.5–5.2)
Chloride: 100 mEq/L (ref 96–112)
Creatinine, Ser: 1.33 mg/dL (ref 0.50–1.35)
Potassium: 3.5 mEq/L (ref 3.5–5.1)
Total Bilirubin: 0.5 mg/dL (ref 0.3–1.2)
Total Protein: 7.6 g/dL (ref 6.0–8.3)

## 2013-11-17 LAB — CG4 I-STAT (LACTIC ACID): Lactic Acid, Venous: 2.51 mmol/L — ABNORMAL HIGH (ref 0.5–2.2)

## 2013-11-17 LAB — URINALYSIS, ROUTINE W REFLEX MICROSCOPIC
Glucose, UA: 100 mg/dL — AB
Ketones, ur: NEGATIVE mg/dL
Leukocytes, UA: NEGATIVE
Nitrite: NEGATIVE
Specific Gravity, Urine: 1.034 — ABNORMAL HIGH (ref 1.005–1.030)
pH: 7 (ref 5.0–8.0)

## 2013-11-17 LAB — URINE MICROSCOPIC-ADD ON

## 2013-11-17 LAB — TROPONIN I: Troponin I: <0.3 ng/mL (ref <0.30)

## 2013-11-17 MED ORDER — DONEPEZIL HCL 10 MG PO TABS
10.0000 mg | ORAL_TABLET | Freq: Every day | ORAL | Status: DC
  Administered 2013-11-17: 10 mg via ORAL
  Filled 2013-11-17 (×2): qty 1

## 2013-11-17 MED ORDER — BISMUTH SUBSALICYLATE 262 MG/15ML PO SUSP
30.0000 mL | ORAL | Status: DC | PRN
  Administered 2013-11-18: 30 mL via ORAL
  Filled 2013-11-17: qty 236

## 2013-11-17 MED ORDER — ONDANSETRON HCL 4 MG PO TABS: 4.0000 mg | ORAL_TABLET | Freq: Four times a day (QID) | ORAL | Status: DC | PRN

## 2013-11-17 MED ORDER — HALOPERIDOL LACTATE 5 MG/ML IJ SOLN
2.0000 mg | Freq: Four times a day (QID) | INTRAMUSCULAR | Status: DC | PRN
  Administered 2013-11-18: 2 mg via INTRAVENOUS
  Filled 2013-11-17: qty 1

## 2013-11-17 MED ORDER — MORPHINE SULFATE 2 MG/ML IJ SOLN
2.0000 mg | Freq: Once | INTRAMUSCULAR | Status: AC
  Administered 2013-11-17: 2 mg via INTRAVENOUS
  Filled 2013-11-17: qty 1

## 2013-11-17 MED ORDER — INSULIN GLARGINE 100 UNIT/ML ~~LOC~~ SOLN
4.0000 [IU] | Freq: Every day | SUBCUTANEOUS | Status: DC
  Administered 2013-11-18: 4 [IU] via SUBCUTANEOUS
  Filled 2013-11-17: qty 0.04

## 2013-11-17 MED ORDER — ONDANSETRON HCL 4 MG/2ML IJ SOLN
4.0000 mg | Freq: Once | INTRAMUSCULAR | Status: AC
Start: 2013-11-17 — End: 2013-11-17
  Administered 2013-11-17: 4 mg via INTRAVENOUS
  Filled 2013-11-17: qty 2

## 2013-11-17 MED ORDER — INSULIN GLARGINE 100 UNIT/ML SOLOSTAR PEN: 4.0000 [IU] | PEN_INJECTOR | Freq: Every morning | SUBCUTANEOUS | Status: DC

## 2013-11-17 MED ORDER — DIGOXIN 125 MCG PO TABS
0.1250 mg | ORAL_TABLET | Freq: Every day | ORAL | Status: DC
  Filled 2013-11-17: qty 1

## 2013-11-17 MED ORDER — ONDANSETRON HCL 4 MG/2ML IJ SOLN: 4.0000 mg | Freq: Four times a day (QID) | INTRAMUSCULAR | Status: DC | PRN

## 2013-11-17 MED ORDER — MEMANTINE HCL 10 MG PO TABS
10.0000 mg | ORAL_TABLET | Freq: Two times a day (BID) | ORAL | Status: DC
  Administered 2013-11-17: 10 mg via ORAL
  Filled 2013-11-17 (×3): qty 1

## 2013-11-17 MED ORDER — ACETAMINOPHEN 650 MG RE SUPP: 650.0000 mg | Freq: Four times a day (QID) | RECTAL | Status: DC | PRN

## 2013-11-17 MED ORDER — PANTOPRAZOLE SODIUM 40 MG PO TBEC: 40.0000 mg | DELAYED_RELEASE_TABLET | Freq: Every day | ORAL | Status: DC

## 2013-11-17 MED ORDER — LORAZEPAM 0.5 MG PO TABS
0.5000 mg | ORAL_TABLET | ORAL | Status: AC
  Administered 2013-11-17: 0.5 mg via ORAL
  Filled 2013-11-17: qty 1

## 2013-11-17 MED ORDER — SODIUM CHLORIDE 0.9 % IV SOLN
INTRAVENOUS | Status: DC
  Administered 2013-11-18: 01:00:00 via INTRAVENOUS

## 2013-11-17 MED ORDER — HYDROMORPHONE HCL 1 MG/ML PO LIQD: 0.5000 mg | ORAL | Status: DC | PRN

## 2013-11-17 MED ORDER — HYDROMORPHONE HCL PF 1 MG/ML IJ SOLN
0.5000 mg | INTRAMUSCULAR | Status: DC | PRN
  Administered 2013-11-17: 0.5 mg via INTRAVENOUS
  Filled 2013-11-17: qty 1

## 2013-11-17 MED ORDER — IOHEXOL 350 MG/ML SOLN
100.0000 mL | Freq: Once | INTRAVENOUS | Status: AC | PRN
  Administered 2013-11-17: 100 mL via INTRAVENOUS

## 2013-11-17 MED ORDER — PANTOPRAZOLE SODIUM 40 MG IV SOLR
40.0000 mg | Freq: Two times a day (BID) | INTRAVENOUS | Status: DC
  Administered 2013-11-18 (×2): 40 mg via INTRAVENOUS
  Filled 2013-11-17 (×4): qty 40

## 2013-11-17 MED ORDER — ACETAMINOPHEN 325 MG PO TABS: 650.0000 mg | ORAL_TABLET | Freq: Four times a day (QID) | ORAL | Status: DC | PRN

## 2013-11-17 MED ORDER — ENOXAPARIN SODIUM 40 MG/0.4ML ~~LOC~~ SOLN
40.0000 mg | SUBCUTANEOUS | Status: DC
  Administered 2013-11-17: 40 mg via SUBCUTANEOUS
  Filled 2013-11-17 (×2): qty 0.4

## 2013-11-17 NOTE — ED Notes (Signed)
Per pt experiencing generalized abd pain that is worse in LLQ. EMS emesis bag with 200 ml on arrival.

## 2013-11-17 NOTE — ED Notes (Signed)
Per EMS pt reports sudden onset of RLQ pain. Pt appears diaphoretic with n/v.

## 2013-11-17 NOTE — Progress Notes (Signed)
Discussed with [facility] physician regarding admission status.

## 2013-11-17 NOTE — ED Notes (Signed)
Bed: WA06 Expected date:  Expected time:  Means of arrival:  Comments: EMS - Abd Pain 79 y/o

## 2013-11-17 NOTE — ED Notes (Signed)
Pt requesting more pain medication. MD notified.

## 2013-11-17 NOTE — H&P (Signed)
Martin Mueller is an 79 y.o. male.   Chief Complaint: abd pain HPI: Martin Mueller is a 79 yo male. He was in [facility] until 10:00 a.m. Today. He had sudden onset of symptoms. Caregiver had come in and gave him breakfast. Suddenly hurt in mid abdomen. He did vomit twice.  EMS called. He had severe pain. He required morphine.  No blood noted.  No fever noted.  He denies cp and sob.  He did have an elevated lactic acid. However, cta abd is negative for a cause.  He is actually feeling better now but we will admit given the severity.   Past Medical History  Diagnosis Date  . Diabetes mellitus   . A-fib   . Dementia   . CVA (cerebral vascular accident)   . Trigeminal neuralgia   . Atrial fibrillation   . Dementia     Past Surgical History  Procedure Laterality Date  . Cataract extraction, bilateral      History reviewed. No pertinent family history. Social History:  reports that he quit smoking about 19 years ago. He has never used smokeless tobacco. He reports that he does not drink alcohol or use illicit drugs.  Married:  Martin Mueller.  Has 3 Children, 2 GC.   Allergies: No Known Allergies  Home meds:  humalog ssi with supper if over 150 lantus 8 u qam Digoxin 0.25 mg 1/2 pill daily namenda 10 mg bid aricept 10 mg daily mvi some days takes one  Results for orders placed during the hospital encounter of 11/02/2013 (from the past 48 hour(s))  CBC WITH DIFFERENTIAL     Status: Abnormal   Collection Time    11/06/2013 11:00 AM      Result Value Range   WBC 9.0  4.0 - 10.5 K/uL   RBC 4.28  4.22 - 5.81 MIL/uL   Hemoglobin 12.5 (*) 13.0 - 17.0 g/dL   HCT 78.4 (*) 69.6 - 29.5 %   MCV 90.4  78.0 - 100.0 fL   MCH 29.2  26.0 - 34.0 pg   MCHC 32.3  30.0 - 36.0 g/dL   RDW 28.4  13.2 - 44.0 %   Platelets 182  150 - 400 K/uL   Neutrophils Relative % 60  43 - 77 %   Neutro Abs 5.4  1.7 - 7.7 K/uL   Lymphocytes Relative 30  12 - 46 %   Lymphs Abs 2.7  0.7 - 4.0 K/uL   Monocytes Relative 8  3 - 12 %   Monocytes Absolute 0.7  0.1 - 1.0 K/uL   Eosinophils Relative 2  0 - 5 %   Eosinophils Absolute 0.2  0.0 - 0.7 K/uL   Basophils Relative 0  0 - 1 %   Basophils Absolute 0.0  0.0 - 0.1 K/uL  COMPREHENSIVE METABOLIC PANEL     Status: Abnormal   Collection Time    11/22/2013 11:00 AM      Result Value Range   Sodium 139  135 - 145 mEq/L   Potassium 3.5  3.5 - 5.1 mEq/L   Chloride 100  96 - 112 mEq/L   CO2 27  19 - 32 mEq/L   Glucose, Bld 228 (*) 70 - 99 mg/dL   BUN 28 (*) 6 - 23 mg/dL   Creatinine, Ser 1.02  0.50 - 1.35 mg/dL   Calcium 9.7  8.4 - 72.5 mg/dL   Total Protein 7.6  6.0 - 8.3 g/dL   Albumin 3.5  3.5 - 5.2  g/dL   AST 25  0 - 37 U/L   ALT 18  0 - 53 U/L   Alkaline Phosphatase 61  39 - 117 U/L   Total Bilirubin 0.5  0.3 - 1.2 mg/dL   GFR calc non Af Amer 45 (*) >90 mL/min   GFR calc Af Amer 53 (*) >90 mL/min   Comment: (NOTE)     The eGFR has been calculated using the CKD EPI equation.     This calculation has not been validated in all clinical situations.     eGFR's persistently <90 mL/min signify possible Chronic Kidney     Disease.  LIPASE, BLOOD     Status: None   Collection Time    11/16/2013 11:00 AM      Result Value Range   Lipase 53  11 - 59 U/L  TROPONIN I     Status: None   Collection Time    10/30/2013 11:00 AM      Result Value Range   Troponin I <0.30  <0.30 ng/mL   Comment:            Due to the release kinetics of cTnI,     a negative result within the first hours     of the onset of symptoms does not rule out     myocardial infarction with certainty.     If myocardial infarction is still suspected,     repeat the test at appropriate intervals.  CG4 I-STAT (LACTIC ACID)     Status: Abnormal   Collection Time    11/08/2013 11:16 AM      Result Value Range   Lactic Acid, Venous 2.51 (*) 0.5 - 2.2 mmol/L  POCT I-STAT, CHEM 8     Status: Abnormal   Collection Time    11/03/2013 11:16 AM      Result Value Range   Sodium 143  135 - 145 mEq/L   Potassium  3.5  3.5 - 5.1 mEq/L   Chloride 102  96 - 112 mEq/L   BUN 28 (*) 6 - 23 mg/dL   Creatinine, Ser 1.61 (*) 0.50 - 1.35 mg/dL   Glucose, Bld 096 (*) 70 - 99 mg/dL   Calcium, Ion 0.45  4.09 - 1.30 mmol/L   TCO2 28  0 - 100 mmol/L   Hemoglobin 14.3  13.0 - 17.0 g/dL   HCT 81.1  91.4 - 78.2 %  URINALYSIS, ROUTINE W REFLEX MICROSCOPIC     Status: Abnormal   Collection Time    10/27/2013  1:00 PM      Result Value Range   Color, Urine YELLOW  YELLOW   APPearance CLEAR  CLEAR   Specific Gravity, Urine 1.034 (*) 1.005 - 1.030   pH 7.0  5.0 - 8.0   Glucose, UA 100 (*) NEGATIVE mg/dL   Hgb urine dipstick TRACE (*) NEGATIVE   Bilirubin Urine NEGATIVE  NEGATIVE   Ketones, ur NEGATIVE  NEGATIVE mg/dL   Protein, ur NEGATIVE  NEGATIVE mg/dL   Urobilinogen, UA 0.2  0.0 - 1.0 mg/dL   Nitrite NEGATIVE  NEGATIVE   Leukocytes, UA NEGATIVE  NEGATIVE  URINE MICROSCOPIC-ADD ON     Status: None   Collection Time    10/25/2013  1:00 PM      Result Value Range   Squamous Epithelial / LPF RARE  RARE   WBC, UA 0-2  <3 WBC/hpf   RBC / HPF 0-2  <3 RBC/hpf   Bacteria, UA RARE  RARE   Dg Chest Port 1 View  11/10/2013   CLINICAL DATA:  Shortness of breath  EXAM: PORTABLE CHEST - 1 VIEW  COMPARISON:  05/03/2013  FINDINGS: Cardiac silhouette is enlarged. Atherosclerotic calcifications identified within the aorta. No focal regions of consolidation no focal infiltrates. The osseous structures are unremarkable.  IMPRESSION: No evidence of acute cardiopulmonary disease.   Electronically Signed   By: Salome Holmes M.D.   On: 11/16/2013 11:21   Ct Angio Abd/pel W/ And/or W/o  11/07/2013   CLINICAL DATA:  79 year old male with back pain, generalized abdominal pain greatest in the left lower quadrant. Initial encounter.  EXAM: CT ANGIOGRAPHY ABDOMEN AND PELVIS WITH CONTRAST AND WITHOUT CONTRAST  TECHNIQUE: Multidetector CT imaging of the abdomen and pelvis was performed using the standard protocol during bolus  administration of intravenous contrast. Multiplanar reconstructed images including MIPs were obtained and reviewed to evaluate the vascular anatomy.  CONTRAST:  OMNIPAQUE IOHEXOL 350 MG/ML SOLN  COMPARISON:  Chest radiographs 11/16/2013 and earlier.  FINDINGS: Cardiomegaly with biatrial enlargement. No pericardial or pleural effusion. Negative lung bases.  Chronic appearing T12 superior endplate deformity, may a degenerative Schmorl's node. Normal lumbar segmentation. No acute osseous abnormality identified.  No pelvic free fluid. Stool ball in the rectum. Negative bladder. Negative sigmoid and left colon. Largely decompressed more proximal colon. Negative cecum and distal small bowel. No dilated small bowel loops. Negative stomach and duodenum.  Liver, gallbladder, spleen, and adrenal glands are within normal limits. Pancreas is normal except for several rounded hypodense lesions at the tail, the largest 11 mm (series 5, image 15). The head and uncinate process are normal.  Both kidneys are within normal limits for age; there is a left upper pole 38 mm simple renal cyst. No abdominal free fluid. No pneumoperitoneum. No focal mesenteric stranding. No lymphadenopathy.  VASCULAR FINDINGS:  Negative descending thoracic aorta except for mild atherosclerotic plaque. Moderate plaque affecting the abdominal aorta. Major abdominal aortic branches remain patent including the IMA. No abdominal aortic aneurysm or dissection. Patent aortoiliac bifurcation. Mild to moderate bilateral common iliac artery ectasia and plaque. Bilateral internal and external iliac arteries remain patent. Mild plaque affecting the external iliac arteries, common femoral arteries, an the origins of the bilateral SFA and PFA are patent.  Review of the MIP images confirms the above findings.  IMPRESSION: 1. Negative distal thoracic and abdominal aorta except for atherosclerosis. Mild iliac artery ectasia.  2. No acute or inflammatory process  identified in the abdomen or pelvis.  3. Multiple small cystic lesions in the pancreatic tail, largest 11 mm, These very likely are benign/indolent and significance is doubtful in this age group. If followup is desired consensus guidelines recommend followup exam in 1 year, preferably pancreatic protocol CT or MRI (Managing Incidental Findings on Abdominal CT: White Paper of the ACR Incidental Findings Committee. J Am Coll Radiol 2010;7:754-773).   Electronically Signed   By: Augusto Gamble M.D.   On: 10/27/2013 12:09    ROS:see above.  He is oriented to person.  Not to place or situation really.  Blood pressure 152/75, pulse 85, resp. rate 16, SpO2 98.00%.  pleasantly demented. nad.  cta bilat. no w.r.r.  heart is rrr no m/r/g. abd is tender in upper areas. no mass.but there is tenderness there.  no hsm.  no edema.  moe times 4.  Assessment/Plan 79 yo male with severe abd pain with mild elevation of lactic acid. No clear source found.  He could be  fighting a  Food poisoning or a gi virus.  He is still rather tender. We will admit. Gently hydrate. Clear liquids.  I do worry about sundowning but if we send him home he may be right back with the abd pain.  He wants dnr and this will be honored.  Ezequiel Kayser, MD 10/27/2013, 4:55 PM

## 2013-11-17 NOTE — ED Notes (Signed)
Pt unable to void at this time. 

## 2013-11-17 NOTE — ED Provider Notes (Signed)
CSN: [id number]     Arrival date & time 12/04/2013  1032 History   First MD Initiated Contact with Patient 12/04/2013 1038     Chief Complaint  Patient presents with  . Abdominal Pain   (Consider location/radiation/quality/duration/timing/severity/associated sxs/prior Treatment) Patient is a 79 y.o. male presenting with abdominal pain.  Abdominal Pain Pain location:  LUQ Pain quality: sharp   Pain radiates to:  Does not radiate Pain severity:  Severe Onset quality:  Sudden Duration:  1 hour Timing:  Constant Progression:  Worsening Chronicity:  New Context comment:  Hx of a fib.  not on anticoagulation Relieved by:  Nothing Worsened by:  Palpation and vomiting Associated symptoms: nausea and vomiting (stomach contents, no blood or coffee ground)   Associated symptoms: no chest pain, no cough, no diarrhea, no fever and no shortness of breath     Past Medical History  Diagnosis Date  . Diabetes mellitus   . A-fib   . Dementia   . CVA (cerebral vascular accident)   . Trigeminal neuralgia   . Atrial fibrillation    Past Surgical History  Procedure Laterality Date  . Cataract extraction, bilateral     No family history on file. History  Substance Use Topics  . Smoking status: Never Smoker   . Smokeless tobacco: Never Used  . Alcohol Use: No    Review of Systems  Constitutional: Negative for fever.  HENT: Negative for congestion.   Respiratory: Negative for cough and shortness of breath.   Cardiovascular: Negative for chest pain.  Gastrointestinal: Positive for nausea, vomiting (stomach contents, no blood or coffee ground) and abdominal pain. Negative for diarrhea.  All other systems reviewed and are negative.    Allergies  Review of patient's allergies indicates no known allergies.  Home Medications   Current Outpatient Rx  Name  Route  Sig  Dispense  Refill  . digoxin (LANOXIN) 0.25 MG tablet   Oral   Take 0.25 mg by mouth daily.         Marland Kitchen donepezil  (ARICEPT) 10 MG tablet   Oral   Take 10 mg by mouth at bedtime as needed.         Marland Kitchen glipiZIDE (GLUCOTROL) 5 MG tablet   Oral   Take 5 mg by mouth 2 (two) times daily before a meal.         . memantine (NAMENDA) 10 MG tablet   Oral   Take 10 mg by mouth 2 (two) times daily.         . pantoprazole (PROTONIX) 40 MG tablet   Oral   Take 40 mg by mouth 2 (two) times daily.         . simvastatin (ZOCOR) 20 MG tablet   Oral   Take 20 mg by mouth every evening.         . warfarin (COUMADIN) 5 MG tablet   Oral   Take 5 mg by mouth daily. Takes 6 days per week, not on wednesdays          BP 168/77  Resp 18  SpO2 97% Physical Exam  Nursing note and vitals reviewed. Constitutional: He is oriented to person, place, and time. He appears well-developed and well-nourished. He appears distressed (appears in pain).  Thin  HENT:  Head: Normocephalic and atraumatic.  Mouth/Throat: Oropharynx is clear and moist.  Eyes: Conjunctivae are normal. Pupils are equal, round, and reactive to light. No scleral icterus.  Neck: Neck supple.  Cardiovascular: Normal rate,  regular rhythm, normal heart sounds and intact distal pulses.   No murmur heard. Pulmonary/Chest: Effort normal and breath sounds normal. No stridor. No respiratory distress. He has no wheezes. He has no rales.  Abdominal: Soft. He exhibits no distension. There is tenderness in the epigastric area, left upper quadrant and left lower quadrant. There is guarding. There is no rigidity and no rebound.  Musculoskeletal: Normal range of motion. He exhibits no edema.  Neurological: He is alert and oriented to person, place, and time.  Skin: Skin is warm. No rash noted. He is diaphoretic.  Psychiatric: He has a normal mood and affect. His behavior is normal.    ED Course  Procedures (including critical care time) Labs Review Labs Reviewed  CBC WITH DIFFERENTIAL - Abnormal; Notable for the following:    Hemoglobin 12.5 (*)     HCT 38.7 (*)    All other components within normal limits  COMPREHENSIVE METABOLIC PANEL - Abnormal; Notable for the following:    Glucose, Bld 228 (*)    BUN 28 (*)    GFR calc non Af Amer 45 (*)    GFR calc Af Amer 53 (*)    All other components within normal limits  URINALYSIS, ROUTINE W REFLEX MICROSCOPIC - Abnormal; Notable for the following:    Specific Gravity, Urine 1.034 (*)    Glucose, UA 100 (*)    Hgb urine dipstick TRACE (*)    All other components within normal limits  CG4 I-STAT (LACTIC ACID) - Abnormal; Notable for the following:    Lactic Acid, Venous 2.51 (*)    All other components within normal limits  POCT I-STAT, CHEM 8 - Abnormal; Notable for the following:    BUN 28 (*)    Creatinine, Ser 1.40 (*)    Glucose, Bld 223 (*)    All other components within normal limits  LIPASE, BLOOD  TROPONIN I  URINE MICROSCOPIC-ADD ON   Imaging Review Dg Chest Port 1 View  11/12/2013   CLINICAL DATA:  Shortness of breath  EXAM: PORTABLE CHEST - 1 VIEW  COMPARISON:  05/03/2013  FINDINGS: Cardiac silhouette is enlarged. Atherosclerotic calcifications identified within the aorta. No focal regions of consolidation no focal infiltrates. The osseous structures are unremarkable.  IMPRESSION: No evidence of acute cardiopulmonary disease.   Electronically Signed   By: Salome Holmes M.D.   On: 11/05/2013 11:21   Ct Angio Abd/pel W/ And/or W/o  11/22/2013   CLINICAL DATA:  79 year old male with back pain, generalized abdominal pain greatest in the left lower quadrant. Initial encounter.  EXAM: CT ANGIOGRAPHY ABDOMEN AND PELVIS WITH CONTRAST AND WITHOUT CONTRAST  TECHNIQUE: Multidetector CT imaging of the abdomen and pelvis was performed using the standard protocol during bolus administration of intravenous contrast. Multiplanar reconstructed images including MIPs were obtained and reviewed to evaluate the vascular anatomy.  CONTRAST:  OMNIPAQUE IOHEXOL 350 MG/ML SOLN  COMPARISON:   Chest radiographs 10/25/2013 and earlier.  FINDINGS: Cardiomegaly with biatrial enlargement. No pericardial or pleural effusion. Negative lung bases.  Chronic appearing T12 superior endplate deformity, may a degenerative Schmorl's node. Normal lumbar segmentation. No acute osseous abnormality identified.  No pelvic free fluid. Stool ball in the rectum. Negative bladder. Negative sigmoid and left colon. Largely decompressed more proximal colon. Negative cecum and distal small bowel. No dilated small bowel loops. Negative stomach and duodenum.  Liver, gallbladder, spleen, and adrenal glands are within normal limits. Pancreas is normal except for several rounded hypodense lesions at the  tail, the largest 11 mm (series 5, image 15). The head and uncinate process are normal.  Both kidneys are within normal limits for age; there is a left upper pole 38 mm simple renal cyst. No abdominal free fluid. No pneumoperitoneum. No focal mesenteric stranding. No lymphadenopathy.  VASCULAR FINDINGS:  Negative descending thoracic aorta except for mild atherosclerotic plaque. Moderate plaque affecting the abdominal aorta. Major abdominal aortic branches remain patent including the IMA. No abdominal aortic aneurysm or dissection. Patent aortoiliac bifurcation. Mild to moderate bilateral common iliac artery ectasia and plaque. Bilateral internal and external iliac arteries remain patent. Mild plaque affecting the external iliac arteries, common femoral arteries, an the origins of the bilateral SFA and PFA are patent.  Review of the MIP images confirms the above findings.  IMPRESSION: 1. Negative distal thoracic and abdominal aorta except for atherosclerosis. Mild iliac artery ectasia.  2. No acute or inflammatory process identified in the abdomen or pelvis.  3. Multiple small cystic lesions in the pancreatic tail, largest 11 mm, These very likely are benign/indolent and significance is doubtful in this age group. If followup is desired  consensus guidelines recommend followup exam in 1 year, preferably pancreatic protocol CT or MRI (Managing Incidental Findings on Abdominal CT: White Paper of the ACR Incidental Findings Committee. J Am Coll Radiol 2010;7:754-773).   Electronically Signed   By: Augusto Gamble M.D.   On: 17-Dec-2013 12:09  All radiology studies independently viewed by me.     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    MDM   1. Abdominal pain    79 year old male with sudden onset left upper quadrant pain and vomiting. On arrival, ill-appearing, diaphoretic, with left upper quadrant abdominal tenderness, voluntary guarding. I initially had concern for mesenteric ischemia, given his history of A. fib and non-anticoagulated state.  CTA of his abdomen and pelvis was obtained which did not show signs of ischemia or other explanation for his abdominal pain. He got some relief with IV Dilaudid.  Given his age and ill appearance, I feel he warrants admission.  Discussed his case with Dr. Waynard Edwards who will admit.   Candyce Churn, MD Dec 17, 2013 1726

## 2013-11-18 DIAGNOSIS — K559 Vascular disorder of intestine, unspecified: Secondary | ICD-10-CM | POA: Diagnosis present

## 2013-11-18 DIAGNOSIS — A419 Sepsis, unspecified organism: Secondary | ICD-10-CM

## 2013-11-18 DIAGNOSIS — Z515 Encounter for palliative care: Secondary | ICD-10-CM

## 2013-11-18 DIAGNOSIS — R109 Unspecified abdominal pain: Secondary | ICD-10-CM

## 2013-11-18 DIAGNOSIS — F0390 Unspecified dementia without behavioral disturbance: Secondary | ICD-10-CM

## 2013-11-18 LAB — COMPREHENSIVE METABOLIC PANEL
ALT: 20 U/L (ref 0–53)
ALT: 16 U/L (ref 0–53)
AST: 50 U/L — ABNORMAL HIGH (ref 0–37)
AST: 43 U/L — ABNORMAL HIGH (ref 0–37)
Albumin: 3.3 g/dL — ABNORMAL LOW (ref 3.5–5.2)
Alkaline Phosphatase: 64 U/L (ref 39–117)
BUN: 42 mg/dL — ABNORMAL HIGH (ref 6–23)
CO2: 24 mEq/L (ref 19–32)
CO2: 20 mEq/L (ref 19–32)
Calcium: 9.6 mg/dL (ref 8.4–10.5)
Calcium: 8.6 mg/dL (ref 8.4–10.5)
Chloride: 99 mEq/L (ref 96–112)
Creatinine, Ser: 1.8 mg/dL — ABNORMAL HIGH (ref 0.50–1.35)
GFR calc Af Amer: 36 mL/min — ABNORMAL LOW (ref >90)
GFR calc non Af Amer: 42 mL/min — ABNORMAL LOW (ref >90)
GFR calc non Af Amer: 31 mL/min — ABNORMAL LOW (ref >90)
Glucose, Bld: 321 mg/dL — ABNORMAL HIGH (ref 70–99)
Potassium: 3.3 mEq/L — ABNORMAL LOW (ref 3.5–5.1)
Sodium: 140 mEq/L (ref 135–145)
Sodium: 140 mEq/L (ref 135–145)
Total Bilirubin: 0.8 mg/dL (ref 0.3–1.2)
Total Bilirubin: 0.7 mg/dL (ref 0.3–1.2)

## 2013-11-18 LAB — GLUCOSE, CAPILLARY: Glucose-Capillary: 294 mg/dL — ABNORMAL HIGH (ref 70–99)

## 2013-11-18 LAB — CBC WITH DIFFERENTIAL/PLATELET
Basophils Absolute: 0 10*3/uL (ref 0.0–0.1)
Basophils Relative: 0 % (ref 0–1)
Eosinophils Absolute: 0 10*3/uL (ref 0.0–0.7)
Eosinophils Relative: 0 % (ref 0–5)
HCT: 41.7 % (ref 39.0–52.0)
Hemoglobin: 13.9 g/dL (ref 13.0–17.0)
MCH: 29.9 pg (ref 26.0–34.0)
MCHC: 33.3 g/dL (ref 30.0–36.0)
MCV: 89.7 fL (ref 78.0–100.0)
Monocytes Absolute: 1.2 10*3/uL — ABNORMAL HIGH (ref 0.1–1.0)
Monocytes Relative: 9 % (ref 3–12)
RDW: 15.2 % (ref 11.5–15.5)

## 2013-11-18 LAB — CBC
HCT: 39.8 % (ref 39.0–52.0)
Hemoglobin: 13.1 g/dL (ref 13.0–17.0)
MCH: 29.6 pg (ref 26.0–34.0)
MCHC: 32.9 g/dL (ref 30.0–36.0)
MCV: 89.8 fL (ref 78.0–100.0)
RDW: 15.3 % (ref 11.5–15.5)

## 2013-11-18 LAB — TYPE AND SCREEN: Antibody Screen: NEGATIVE

## 2013-11-18 LAB — LACTIC ACID, PLASMA: Lactic Acid, Venous: 5.6 mmol/L — ABNORMAL HIGH (ref 0.5–2.2)

## 2013-11-18 LAB — HEMOGLOBIN AND HEMATOCRIT, BLOOD: Hemoglobin: 13.3 g/dL (ref 13.0–17.0)

## 2013-11-18 MED ORDER — MORPHINE SULFATE 2 MG/ML IJ SOLN
2.0000 mg | INTRAMUSCULAR | Status: DC | PRN
  Administered 2013-11-18: 2 mg via INTRAVENOUS
  Filled 2013-11-18: qty 1

## 2013-11-18 MED ORDER — DILTIAZEM HCL 100 MG IV SOLR
5.0000 mg/h | INTRAVENOUS | Status: DC
  Administered 2013-11-18: 5 mg/h via INTRAVENOUS
  Filled 2013-11-18: qty 100

## 2013-11-18 MED ORDER — METOPROLOL TARTRATE 1 MG/ML IV SOLN: 2.5000 mg | INTRAVENOUS | Status: DC | PRN

## 2013-11-18 MED ORDER — DEXTROSE 5 % IV SOLN
30.0000 ug/min | INTRAVENOUS | Status: DC
  Administered 2013-11-18: 60 ug/min via INTRAVENOUS
  Filled 2013-11-18: qty 4

## 2013-11-18 MED ORDER — CHLORHEXIDINE GLUCONATE 0.12 % MT SOLN
15.0000 mL | Freq: Two times a day (BID) | OROMUCOSAL | Status: DC
  Administered 2013-11-18: 15 mL via OROMUCOSAL
  Filled 2013-11-18: qty 15

## 2013-11-18 MED ORDER — FUROSEMIDE 10 MG/ML IJ SOLN
INTRAMUSCULAR | Status: AC
  Administered 2013-11-18: 40 mg
  Filled 2013-11-18: qty 4

## 2013-11-18 MED ORDER — DILTIAZEM LOAD VIA INFUSION
5.0000 mg | Freq: Once | INTRAVENOUS | Status: AC
  Administered 2013-11-18: 5 mg via INTRAVENOUS
  Filled 2013-11-18: qty 5

## 2013-11-18 MED ORDER — DILTIAZEM HCL 25 MG/5ML IV SOLN: 5.0000 mg | Freq: Once | INTRAVENOUS | Status: DC

## 2013-11-18 MED ORDER — PHENYLEPHRINE HCL 10 MG/ML IJ SOLN
30.0000 ug/min | INTRAVENOUS | Status: DC
  Administered 2013-11-18: 30 ug/min via INTRAVENOUS
  Filled 2013-11-18: qty 1

## 2013-11-18 MED ORDER — BIOTENE DRY MOUTH MT LIQD
15.0000 mL | Freq: Two times a day (BID) | OROMUCOSAL | Status: DC
  Administered 2013-11-18: 15 mL via OROMUCOSAL

## 2013-11-18 MED ORDER — SODIUM CHLORIDE 0.9 % IV BOLUS (SEPSIS)
1000.0000 mL | Freq: Once | INTRAVENOUS | Status: AC
  Administered 2013-11-18: 1000 mL via INTRAVENOUS

## 2013-11-18 MED ORDER — INSULIN ASPART 100 UNIT/ML ~~LOC~~ SOLN
0.0000 [IU] | Freq: Three times a day (TID) | SUBCUTANEOUS | Status: DC
  Administered 2013-11-18: 5 [IU] via SUBCUTANEOUS

## 2013-11-18 MED ORDER — FUROSEMIDE 10 MG/ML IJ SOLN
40.0000 mg | Freq: Once | INTRAMUSCULAR | Status: AC
  Administered 2013-11-18: 40 mg via INTRAVENOUS

## 2013-11-20 LAB — GLUCOSE, CAPILLARY: Glucose-Capillary: 273 mg/dL — ABNORMAL HIGH (ref 70–99)

## 2013-11-23 NOTE — Consult Note (Addendum)
Name: Martin Mueller MRN: [id number] DOB: 09-May-1921    ADMISSION DATE:  06-Dec-2013 CONSULTATION DATE:  11/09/2013   REFERRING MD :  Waynard Edwards PRIMARY SERVICE:  Perini  CHIEF COMPLAINT:  Acute abd pain  BRIEF PATIENT DESCRIPTION:  80 y.o.M with acute onset abd pain and diarrhea. Suspect acute ischemic bowel, probably embolic in nature.  Pccm called to help   SIGNIFICANT EVENTS / STUDIES:  ABD CT 12/26: no acute process  LINES / TUBES: PIV  CULTURES: C Diff 12/27  ANTIBIOTICS: NONE   PAST MEDICAL HISTORY :  Past Medical History  Diagnosis Date  . Diabetes mellitus   . A-fib   . Dementia   . CVA (cerebral vascular accident)   . Trigeminal neuralgia   . Atrial fibrillation   . Dementia    Past Surgical History  Procedure Laterality Date  . Cataract extraction, bilateral     Prior to Admission medications   Medication Sig Start Date End Date Taking? Authorizing Provider  digoxin (LANOXIN) 0.25 MG tablet Take 0.25 mg by mouth daily.   Yes Historical Provider, MD  donepezil (ARICEPT) 10 MG tablet Take 10 mg by mouth at bedtime as needed.   Yes Historical Provider, MD  Insulin Glargine (LANTUS SOLOSTAR) 100 UNIT/ML SOPN Inject 8 Units into the skin every morning.   Yes Historical Provider, MD  insulin lispro (HUMALOG KWIKPEN) 100 UNIT/ML SOPN Inject 3-5 Units into the skin See admin instructions. Give in the evening only if blood sugar is 150 or above. 3 units if bs is 150-200 and 5 units if >200.   Yes Historical Provider, MD  memantine (NAMENDA) 10 MG tablet Take 10 mg by mouth 2 (two) times daily.   Yes Historical Provider, MD   No Known Allergies  FAMILY HISTORY:  History reviewed. No pertinent family history. SOCIAL HISTORY:  reports that he quit smoking about 19 years ago. He has never used smokeless tobacco. He reports that he does not drink alcohol or use illicit drugs.  REVIEW OF SYSTEMS:   Not obtainable from pt.  Hx of rectal bleeding and abd pain and  diarrhea  SUBJECTIVE:  On bipap, bp low VITAL SIGNS: Temp:  [97.7 F (36.5 C)-98.2 F (36.8 C)] 98.1 F (36.7 C) (12/27 1200) Pulse Rate:  [27-174] 27 (12/27 1230) Resp:  [16-35] 26 (12/27 1230) BP: (48-153)/(27-84) 48/31 mmHg (12/27 1230) SpO2:  [66 %-100 %] 95 % (12/27 1230) FiO2 (%):  [100 %] 100 % (12/27 1130) Weight:  [59.875 kg (132 lb)-60.328 kg (133 lb)] 60.328 kg (133 lb) (12/26 2011)  PHYSICAL EXAMINATION: General:  Altered in mild resp distress on bipap Neuro:  altered HEENT:  Dry mucus membranes Cardiovascular:  tachy Lungs:  rales Abdomen:  Rigid, diffusely tender, ++rebound and guarding Musculoskeletal:  from Skin:  clear   Recent Labs Lab 2013-12-06 1100 12-06-2013 1116 11/04/2013 0412 11/20/2013 1055  NA 139 143 140 140  K 3.5 3.5 3.3* 3.5  CL 100 102 99 105  CO2 27  --  24 20  BUN 28* 28* 36* 42*  CREATININE 1.33 1.40* 1.43* 1.80*  GLUCOSE 228* 223* 367* 321*    Recent Labs Lab 12-06-2013 1100  10/25/2013 0412 10/24/2013 0915 11/06/2013 1055  HGB 12.5*  < > 13.9 13.3 13.1  HCT 38.7*  < > 41.7 40.1 39.8  WBC 9.0  --  13.7*  --  10.9*  PLT 182  --  174  --  169  < > = values in this  interval not displayed. Dg Chest Port 1 View  11/13/2013   CLINICAL DATA:  Shortness of breath  EXAM: PORTABLE CHEST - 1 VIEW  COMPARISON:  05/03/2013  FINDINGS: Cardiac silhouette is enlarged. Atherosclerotic calcifications identified within the aorta. No focal regions of consolidation no focal infiltrates. The osseous structures are unremarkable.  IMPRESSION: No evidence of acute cardiopulmonary disease.   Electronically Signed   By: Salome Holmes M.D.   On: 11/04/2013 11:21   Ct Angio Abd/pel W/ And/or W/o  11/14/2013   CLINICAL DATA:  80 year old male with back pain, generalized abdominal pain greatest in the left lower quadrant. Initial encounter.  EXAM: CT ANGIOGRAPHY ABDOMEN AND PELVIS WITH CONTRAST AND WITHOUT CONTRAST  TECHNIQUE: Multidetector CT imaging of the abdomen  and pelvis was performed using the standard protocol during bolus administration of intravenous contrast. Multiplanar reconstructed images including MIPs were obtained and reviewed to evaluate the vascular anatomy.  CONTRAST:  OMNIPAQUE IOHEXOL 350 MG/ML SOLN  COMPARISON:  Chest radiographs 11/16/2013 and earlier.  FINDINGS: Cardiomegaly with biatrial enlargement. No pericardial or pleural effusion. Negative lung bases.  Chronic appearing T12 superior endplate deformity, may a degenerative Schmorl's node. Normal lumbar segmentation. No acute osseous abnormality identified.  No pelvic free fluid. Stool ball in the rectum. Negative bladder. Negative sigmoid and left colon. Largely decompressed more proximal colon. Negative cecum and distal small bowel. No dilated small bowel loops. Negative stomach and duodenum.  Liver, gallbladder, spleen, and adrenal glands are within normal limits. Pancreas is normal except for several rounded hypodense lesions at the tail, the largest 11 mm (series 5, image 15). The head and uncinate process are normal.  Both kidneys are within normal limits for age; there is a left upper pole 38 mm simple renal cyst. No abdominal free fluid. No pneumoperitoneum. No focal mesenteric stranding. No lymphadenopathy.  VASCULAR FINDINGS:  Negative descending thoracic aorta except for mild atherosclerotic plaque. Moderate plaque affecting the abdominal aorta. Major abdominal aortic branches remain patent including the IMA. No abdominal aortic aneurysm or dissection. Patent aortoiliac bifurcation. Mild to moderate bilateral common iliac artery ectasia and plaque. Bilateral internal and external iliac arteries remain patent. Mild plaque affecting the external iliac arteries, common femoral arteries, an the origins of the bilateral SFA and PFA are patent.  Review of the MIP images confirms the above findings.  IMPRESSION: 1. Negative distal thoracic and abdominal aorta except for atherosclerosis.  Mild iliac artery ectasia.  2. No acute or inflammatory process identified in the abdomen or pelvis.  3. Multiple small cystic lesions in the pancreatic tail, largest 11 mm, These very likely are benign/indolent and significance is doubtful in this age group. If followup is desired consensus guidelines recommend followup exam in 1 year, preferably pancreatic protocol CT or MRI (Managing Incidental Findings on Abdominal CT: White Paper of the ACR Incidental Findings Committee. J Am Coll Radiol 2010;7:754-773).   Electronically Signed   By: Augusto Gamble M.D.   On: 11/02/2013 12:09    ASSESSMENT / PLAN: Principal Problem:   Ischemic bowel disease Active Problems:   Ischemic colitis   Septic shock(785.52)   Severe sepsis(995.92)   Dementia   #1 Acute abdominal sepsis with prob ischemic bowel .  Lactic acidosis. Prob septic shock.   Likely lethal event.  The pt is a DNR DNI and not a candidate for invasive procedures.  I spoke to the son and wife. They are in total agreement to move to a comfort care approach.   Plan  Focus more on comfort care Wean bipap to off over time Morphine use more liberally Hold on IV ABX for now No ETT No vent No CVL Try to get second peripheral IV and use low dose Neo.  #2 Afib RVR.    I would avoid digoxin  Cannot use dilt drip  Would not use amio   Can try low dose IV beta blockade with neo to get HR down  Low dose morphine will help  I spoke to primary MD Perini who also agrees with this plan Family fully updated at bedside and they agree with this plan  CC60min  Dorcas Carrow Beeper  [phone]  Cell  [phone]  If no response or cell goes to voicemail, call beeper [phone]  Pulmonary and Critical Care Medicine [facility] Pager: [phone]  11/11/2013, 12:58 PM

## 2013-11-23 NOTE — Consult Note (Signed)
GI Consultation  Referring Provider: No ref. provider found Primary Care Physician:  Minda Meo, MD Primary Gastroenterologist:    Reason for Consultation:  *Rectal bleeding**  HPI: Martin Mueller is a 80 y.o. male with history of diabetes, atrial fibrillation, CVA, dementia, admitted with sudden onset severe*epigastric pain.  CT scan, which I reviewed, did not demonstrate any acute abdominal abnormalities.  Benign-appearing pancreatic cysts were seen.  Atherosclerotic changes were seen in the aortic vessels.  Overnight the patient developed tachycardia, hypotension and had several bloody bowel movements.**Repeat hemoglobin did not show a significant change.   Past Medical History  Diagnosis Date  . Diabetes mellitus   . A-fib   . Dementia   . CVA (cerebral vascular accident)   . Trigeminal neuralgia   . Atrial fibrillation   . Dementia     Past Surgical History  Procedure Laterality Date  . Cataract extraction, bilateral      Prior to Admission medications   Medication Sig Start Date End Date Taking? Authorizing Provider  digoxin (LANOXIN) 0.25 MG tablet Take 0.25 mg by mouth daily.   Yes Historical Provider, MD  donepezil (ARICEPT) 10 MG tablet Take 10 mg by mouth at bedtime as needed.   Yes Historical Provider, MD  Insulin Glargine (LANTUS SOLOSTAR) 100 UNIT/ML SOPN Inject 8 Units into the skin every morning.   Yes Historical Provider, MD  insulin lispro (HUMALOG KWIKPEN) 100 UNIT/ML SOPN Inject 3-5 Units into the skin See admin instructions. Give in the evening only if blood sugar is 150 or above. 3 units if bs is 150-200 and 5 units if >200.   Yes Historical Provider, MD  memantine (NAMENDA) 10 MG tablet Take 10 mg by mouth 2 (two) times daily.   Yes Historical Provider, MD    Current Facility-Administered Medications  Medication Dose Route Frequency Provider Last Rate Last Dose  . 0.9 %  sodium chloride infusion   Intravenous Continuous Ezequiel Kayser, MD  30 mL/hr at 12-12-2013 0700    . acetaminophen (TYLENOL) tablet 650 mg  650 mg Oral Q6H PRN Ezequiel Kayser, MD       Or  . acetaminophen (TYLENOL) suppository 650 mg  650 mg Rectal Q6H PRN Ezequiel Kayser, MD      . antiseptic oral rinse (BIOTENE) solution 15 mL  15 mL Mouth Rinse q12n4p Minda Meo, MD      . bismuth subsalicylate (PEPTO BISMOL) 262 MG/15ML suspension 30 mL  30 mL Oral Q4H PRN Minda Meo, MD   30 mL at 12-12-2013 0100  . chlorhexidine (PERIDEX) 0.12 % solution 15 mL  15 mL Mouth Rinse BID Minda Meo, MD   15 mL at 2013-12-12 0837  . digoxin (LANOXIN) tablet 0.125 mg  0.125 mg Oral Daily Ezequiel Kayser, MD      . diltiazem (CARDIZEM) 100 mg in dextrose 5 % 100 mL infusion  5-15 mg/hr Intravenous Continuous Minda Meo, MD   5 mg/hr at 12-Dec-2013 0640  . donepezil (ARICEPT) tablet 10 mg  10 mg Oral QHS Ezequiel Kayser, MD   10 mg at 11/17/2013 2149  . haloperidol lactate (HALDOL) injection 2 mg  2 mg Intravenous Q6H PRN Ezequiel Kayser, MD   2 mg at 2013/12/12 0116  . HYDROmorphone HCl (DILAUDID) liquid 0.5-1 mg  0.5-1 mg Oral Q3H PRN Minda Meo, MD      . insulin aspart (novoLOG) injection 0-9  Units  0-9 Units Subcutaneous TID WC Ezequiel Kayser, MD      . insulin glargine (LANTUS) injection 4 Units  4 Units Subcutaneous Daily Minda Meo, MD   4 Units at 11/10/2013 1125  . memantine (NAMENDA) tablet 10 mg  10 mg Oral BID Ezequiel Kayser, MD   10 mg at 11/22/2013 2149  . ondansetron (ZOFRAN) tablet 4 mg  4 mg Oral Q6H PRN Ezequiel Kayser, MD       Or  . ondansetron (ZOFRAN) injection 4 mg  4 mg Intravenous Q6H PRN Ezequiel Kayser, MD      . pantoprazole (PROTONIX) injection 40 mg  40 mg Intravenous Q12H Ezequiel Kayser, MD   40 mg at 11/22/2013 1122    Allergies as of 10/24/2013  . (No Known Allergies)    History reviewed. No pertinent family history.  History   Social History  . Marital Status: Married    Spouse Name: N/A    Number of Children: N/A  . Years of  Education: N/A   Occupational History  . Not on file.   Social History Main Topics  . Smoking status: Former Smoker    Quit date: 11/17/1994  . Smokeless tobacco: Never Used  . Alcohol Use: No  . Drug Use: No  . Sexual Activity: Not on file   Other Topics Concern  . Not on file   Social History Narrative  . No narrative on file    Review of Systems: Unable to obtain history from patient  Physical Exam: Vital signs in last 24 hours: Temp:  [97.7 F (36.5 C)-98.2 F (36.8 C)] 97.9 F (36.6 C) (12/27 0800) Pulse Rate:  [74-174] 151 (12/27 1045) Resp:  [16-35] 35 (12/27 1045) BP: (68-166)/(27-84) 103/38 mmHg (12/27 1045) SpO2:  [73 %-100 %] 73 % (12/27 1045) FiO2 (%):  [100 %] 100 % (12/27 1120) Weight:  [132 lb (59.875 kg)-133 lb (60.328 kg)] 133 lb (60.328 kg) (12/26 2011)  General:  Ill-appearing male awake but not responsive Head:  Normocephalic and atraumatic. Eyes:  Sclera clear, no icterus.   Conjunctiva pink. Ears:  Normal auditory acuity. Nose:  No deformity, discharge,  or lesions. Mouth:  No deformity or lesions.  Oropharynx pink & moist. Neck:  Supple; no masses or thyromegaly. Lungs:  Clear throughout to auscultation.   No wheezes, crackles, or rhonchi. No acute distress. Heart:  Regular rate and rhythm; no murmurs, clicks, rubs,  or gallops. Abdomen:  Bowel sounds are decreased.  Abdomen is diffusely exquisitely tender with guarding but without rebound.   There are no masses or organomegaly Rectal:  Deferred until time of colonoscopy.   Msk:  Symmetrical without gross deformities. Normal posture. Pulses:  Normal pulses noted. Extremities:  Without clubbing or edema. Neurologic:  Alert and  oriented x4;  grossly normal neurologically. Skin:  Intact without significant lesions or rashes. Cervical Nodes:  No significant cervical adenopathy. Psych:  Alert and cooperative. Normal mood and affect.  Intake/Output from previous day: 12/26 0701 - 12/27 0700 In:  -  Out: 9 [Urine:9] Intake/Output this shift: Total I/O In: 950 [I.V.:450; IV Piggyback:500] Out: -   Lab Results:  Recent Labs  11/17/13 1100  11/20/2013 0412 11/15/2013 0915 11/13/2013 1055  WBC 9.0  --  13.7*  --  10.9*  HGB 12.5*  < > 13.9 13.3 13.1  HCT 38.7*  < > 41.7 40.1 39.8  PLT 182  --  174  --  169  < > =  values in this interval not displayed. BMET  Recent Labs  11/05/2013 1100 10/28/2013 1116 10/30/2013 0412 11/19/2013 1055  NA 139 143 140 140  K 3.5 3.5 3.3* 3.5  CL 100 102 99 105  CO2 27  --  24 20  GLUCOSE 228* 223* 367* 321*  BUN 28* 28* 36* 42*  CREATININE 1.33 1.40* 1.43* 1.80*  CALCIUM 9.7  --  9.6 8.6   LFT  Recent Labs  11/20/2013 1055  PROT 6.2  ALBUMIN 2.8*  AST 43*  ALT 16  ALKPHOS 48  BILITOT 0.7   PT/INR No results found for this basename: LABPROT, INR,  in the last 72 hours Hepatitis Panel No results found for this basename: HEPBSAG, HCVAB, HEPAIGM, HEPBIGM,  in the last 72 hours Additional Labs *Amylase 180, lipase 107.**Lactic acid 5.6  Studies/Results: Dg Chest Port 1 View  11/11/2013   CLINICAL DATA:  Shortness of breath  EXAM: PORTABLE CHEST - 1 VIEW  COMPARISON:  05/03/2013  FINDINGS: Cardiac silhouette is enlarged. Atherosclerotic calcifications identified within the aorta. No focal regions of consolidation no focal infiltrates. The osseous structures are unremarkable.  IMPRESSION: No evidence of acute cardiopulmonary disease.   Electronically Signed   By: Salome Holmes M.D.   On: 11/10/2013 11:21   Ct Angio Abd/pel W/ And/or W/o  11/14/2013   CLINICAL DATA:  80 year old male with back pain, generalized abdominal pain greatest in the left lower quadrant. Initial encounter.  EXAM: CT ANGIOGRAPHY ABDOMEN AND PELVIS WITH CONTRAST AND WITHOUT CONTRAST  TECHNIQUE: Multidetector CT imaging of the abdomen and pelvis was performed using the standard protocol during bolus administration of intravenous contrast. Multiplanar reconstructed images  including MIPs were obtained and reviewed to evaluate the vascular anatomy.  CONTRAST:  OMNIPAQUE IOHEXOL 350 MG/ML SOLN  COMPARISON:  Chest radiographs 11/06/2013 and earlier.  FINDINGS: Cardiomegaly with biatrial enlargement. No pericardial or pleural effusion. Negative lung bases.  Chronic appearing T12 superior endplate deformity, may a degenerative Schmorl's node. Normal lumbar segmentation. No acute osseous abnormality identified.  No pelvic free fluid. Stool ball in the rectum. Negative bladder. Negative sigmoid and left colon. Largely decompressed more proximal colon. Negative cecum and distal small bowel. No dilated small bowel loops. Negative stomach and duodenum.  Liver, gallbladder, spleen, and adrenal glands are within normal limits. Pancreas is normal except for several rounded hypodense lesions at the tail, the largest 11 mm (series 5, image 15). The head and uncinate process are normal.  Both kidneys are within normal limits for age; there is a left upper pole 38 mm simple renal cyst. No abdominal free fluid. No pneumoperitoneum. No focal mesenteric stranding. No lymphadenopathy.  VASCULAR FINDINGS:  Negative descending thoracic aorta except for mild atherosclerotic plaque. Moderate plaque affecting the abdominal aorta. Major abdominal aortic branches remain patent including the IMA. No abdominal aortic aneurysm or dissection. Patent aortoiliac bifurcation. Mild to moderate bilateral common iliac artery ectasia and plaque. Bilateral internal and external iliac arteries remain patent. Mild plaque affecting the external iliac arteries, common femoral arteries, an the origins of the bilateral SFA and PFA are patent.  Review of the MIP images confirms the above findings.  IMPRESSION: 1. Negative distal thoracic and abdominal aorta except for atherosclerosis. Mild iliac artery ectasia.  2. No acute or inflammatory process identified in the abdomen or pelvis.  3. Multiple small cystic lesions in the  pancreatic tail, largest 11 mm, These very likely are benign/indolent and significance is doubtful in this age group. If followup  is desired consensus guidelines recommend followup exam in 1 year, preferably pancreatic protocol CT or MRI (Managing Incidental Findings on Abdominal CT: White Paper of the ACR Incidental Findings Committee. J Am Coll Radiol 2010;7:754-773).   Electronically Signed   By: Augusto Gamble M.D.   On: 11/16/2013 12:09    Active Problems:   Abdominal pain   Recommendations -  1.  broad-spectrum antibiotics 2.  twice a day PPI therapy 3.  serial amylase, lactic acid, CBC* 4.  hold endoscopic studies unless it becomes apparent that the patient is actively GI bleeding**  Assessment - *strongly suspect patient is suffering from mesenteric ischemia, probably secondary to arterial embolization to the SMA.  GI bleeding appears to be minor.  Prognosis is poor since the patient is not a surgical candidate.Molly Maduro D. Arlyce Dice, MD, [facility] [phone]    2013-12-17, 11:44 AM

## 2013-11-23 NOTE — Progress Notes (Signed)
Called to room 1305 per Barkley Bruns RN at 0600. PT with elevated pulse and low bp on floor monitor. Upon my arrival at 0610 pt resting quietly in bed, snoring.  Pt placed on RRT monitor. HR 140-16 afib. EKG obtained yielding AFIB RVR. Pt BP soft,  see flow sheet for VS. MD on call for Dr. Jacky Kindle paged to update on pt status and for orders per [facility] Dr Waynard Edwards on call,  orders given for cardizem gtt and bolus. Bolus and gtt started, pt transferred to ICU 1238 at 0645. Pt settled in bed, report given to [employer] per myself and Surveyor, minerals.

## 2013-11-23 NOTE — Progress Notes (Addendum)
Chaplain paged at 1637. Mr Needs died at 18 and chaplain requested to assist family and family pastor. Assisted in working out details for calling house coverage with a funeral home name and obtaining list of funeral homes in the area. Mr Nanney's death was sudden and unexpected and caught the family off guard. Support offered to the attending pastor for any needs she may have. The pastor had no requests.  Family wished to pass on that the care provided was outstanding and they deep appreciated all the care staff..  No follow up spiritual care anticipated.  Benjie Karvonen. Aundraya Dripps

## 2013-11-23 NOTE — Progress Notes (Signed)
TOD 1622. Pronounced with Len Blalock, RN. Dr. Waynard Edwards paged

## 2013-11-23 NOTE — Progress Notes (Signed)
Subjective: He has had a major decline.  He is critically ill.  I greatly appreciate CCM and GI input. He is not responsive now and on bipap with very low bp  48 sbp now.  Objective: Vital signs in last 24 hours: Temp:  [97.7 F (36.5 C)-98.2 F (36.8 C)] 98.1 F (36.7 C) (12/27 1200) Pulse Rate:  [27-174] 27 (12/27 1230) Resp:  [16-35] 26 (12/27 1230) BP: (48-153)/(27-84) 48/31 mmHg (12/27 1230) SpO2:  [66 %-100 %] 95 % (12/27 1230) FiO2 (%):  [100 %] 100 % (12/27 1130) Weight:  [59.875 kg (132 lb)-60.328 kg (133 lb)] 60.328 kg (133 lb) (12/26 2011) Weight change:  Last BM Date: 12/12/13  Intake/Output from previous day: 12/26 0701 - 12/27 0700 In: -  Out: 9 [Urine:9] Intake/Output this shift: Total I/O In: 970 [I.V.:470; IV Piggyback:500] Out: 80 [Urine:80]  General appearance: toxic Resp: clear to auscultation bilaterally Cardio: distant, tachy GI: firm, distended. Extremities: extremities normal, atraumatic, no cyanosis or edema Neurologic: obtunded.  Lab Results:  Recent Labs  12/12/2013 0412 2013/12/12 0915 12-12-2013 1055  WBC 13.7*  --  10.9*  HGB 13.9 13.3 13.1  HCT 41.7 40.1 39.8  PLT 174  --  169   BMET  Recent Labs  2013/12/12 0412 12/12/2013 1055  NA 140 140  K 3.3* 3.5  CL 99 105  CO2 24 20  GLUCOSE 367* 321*  BUN 36* 42*  CREATININE 1.43* 1.80*  CALCIUM 9.6 8.6   CMET CMP     Component Value Date/Time   NA 140 12/12/13 1055   K 3.5 December 12, 2013 1055   CL 105 December 12, 2013 1055   CO2 20 12/12/2013 1055   GLUCOSE 321* 12/12/13 1055   BUN 42* 2013/12/12 1055   CREATININE 1.80* Dec 12, 2013 1055   CALCIUM 8.6 12/12/13 1055   PROT 6.2 2013-12-12 1055   ALBUMIN 2.8* 12-12-2013 1055   AST 43* 12-12-2013 1055   ALT 16 December 12, 2013 1055   ALKPHOS 48 December 12, 2013 1055   BILITOT 0.7 12/12/2013 1055   GFRNONAA 31* 12/12/2013 1055   GFRAA 36* 12-12-13 1055     Studies/Results: Dg Chest Port 1 View  10/31/2013   CLINICAL DATA:  Shortness of  breath  EXAM: PORTABLE CHEST - 1 VIEW  COMPARISON:  05/03/2013  FINDINGS: Cardiac silhouette is enlarged. Atherosclerotic calcifications identified within the aorta. No focal regions of consolidation no focal infiltrates. The osseous structures are unremarkable.  IMPRESSION: No evidence of acute cardiopulmonary disease.   Electronically Signed   By: Salome Holmes M.D.   On: 11/05/2013 11:21   Ct Angio Abd/pel W/ And/or W/o  10/26/2013   CLINICAL DATA:  80 year old male with back pain, generalized abdominal pain greatest in the left lower quadrant. Initial encounter.  EXAM: CT ANGIOGRAPHY ABDOMEN AND PELVIS WITH CONTRAST AND WITHOUT CONTRAST  TECHNIQUE: Multidetector CT imaging of the abdomen and pelvis was performed using the standard protocol during bolus administration of intravenous contrast. Multiplanar reconstructed images including MIPs were obtained and reviewed to evaluate the vascular anatomy.  CONTRAST:  OMNIPAQUE IOHEXOL 350 MG/ML SOLN  COMPARISON:  Chest radiographs 11/20/2013 and earlier.  FINDINGS: Cardiomegaly with biatrial enlargement. No pericardial or pleural effusion. Negative lung bases.  Chronic appearing T12 superior endplate deformity, may a degenerative Schmorl's node. Normal lumbar segmentation. No acute osseous abnormality identified.  No pelvic free fluid. Stool ball in the rectum. Negative bladder. Negative sigmoid and left colon. Largely decompressed more proximal colon. Negative cecum and distal small bowel. No dilated  small bowel loops. Negative stomach and duodenum.  Liver, gallbladder, spleen, and adrenal glands are within normal limits. Pancreas is normal except for several rounded hypodense lesions at the tail, the largest 11 mm (series 5, image 15). The head and uncinate process are normal.  Both kidneys are within normal limits for age; there is a left upper pole 38 mm simple renal cyst. No abdominal free fluid. No pneumoperitoneum. No focal mesenteric stranding. No  lymphadenopathy.  VASCULAR FINDINGS:  Negative descending thoracic aorta except for mild atherosclerotic plaque. Moderate plaque affecting the abdominal aorta. Major abdominal aortic branches remain patent including the IMA. No abdominal aortic aneurysm or dissection. Patent aortoiliac bifurcation. Mild to moderate bilateral common iliac artery ectasia and plaque. Bilateral internal and external iliac arteries remain patent. Mild plaque affecting the external iliac arteries, common femoral arteries, an the origins of the bilateral SFA and PFA are patent.  Review of the MIP images confirms the above findings.  IMPRESSION: 1. Negative distal thoracic and abdominal aorta except for atherosclerosis. Mild iliac artery ectasia.  2. No acute or inflammatory process identified in the abdomen or pelvis.  3. Multiple small cystic lesions in the pancreatic tail, largest 11 mm, These very likely are benign/indolent and significance is doubtful in this age group. If followup is desired consensus guidelines recommend followup exam in 1 year, preferably pancreatic protocol CT or MRI (Managing Incidental Findings on Abdominal CT: White Paper of the ACR Incidental Findings Committee. J Am Coll Radiol 2010;7:754-773).   Electronically Signed   By: Augusto Gamble M.D.   On: 11/04/2013 12:09    Medications: I have reviewed the patient's current medications.  Marland Kitchen antiseptic oral rinse  15 mL Mouth Rinse q12n4p  . chlorhexidine  15 mL Mouth Rinse BID  . insulin aspart  0-9 Units Subcutaneous TID WC     Assessment/Plan:  Principal Problem:   Ischemic bowel disease-he is most likely terminally ill at this point. Family is at bedside.  CCM has given orders for additional morphine.  An attempt to raise bp with neosynephrine will be made but i suspect this will not be effective. DNR/DNI now. Active Problems:   Ischemic colitis-see above   Septic shock(785.52)-see above   Severe sepsis(995.92)-see above   Dementia-significant  issue. Patient not a surgical candidate.   Comfort measures only status   LOS: 1 day   Ezequiel Kayser, MD 12-03-13, 1:29 PM

## 2013-11-23 NOTE — Progress Notes (Signed)
Pt's v/s this am 96/59, HR 120, repeat v/s 96/59 HR 122. Pt slow to respond Rapid Response Nurse paged to bed side HR 130 -140  ST/ AFib. Stat EKG done Afib with RVR HR135. Paged MD Perini @ [phone]. Orders given to Tx the Pt to ICU and start on Caredizem drip. Will tx and report off to day shift RN.

## 2013-11-23 NOTE — Progress Notes (Addendum)
Late entry.  Pt alert to self, combative, trying to get oob, pulled NSL out of lf arm. Tried asking the Pt's wife to sit with the Pt. The wife is unable to stay at this time, no current sitter available at this time. Orders given for po Ativan and iv Haldol and soft wrist resistants for safety .The Pt is a high fall risk. Will monitor. Estill Dooms, RN 5:21 AM  10/24/2013

## 2013-11-23 DEATH — deceased

## 2013-12-04 NOTE — Discharge Summary (Signed)
  Physician Discharge Summary  Patient ID: Julio AlmBill C Landing MRN: [id number] DOB/AGE: 78/12/1920 78 y.o.  Admit date: 11/08/2013 Discharge date: 12/04/2013  Admission Diagnoses:  Abdominal pain  Diagnoses at Time of Death:  Principal Problem:   Ischemic bowel disease-suspected intraabdominal embolism Active Problems:   Ischemic colitis   Septic shock(785.52)   Severe sepsis(995.92)   Dementia   Comfort measures only status   Discharged Condition: deceased  Hospital Course: Annette StableBill is a 80 yo gentleman with moderate dementia.  He presented with acute abdominal pain and tenderness.   Initially ct a of the abdomen was negative for any abnormality. He did have a mildly elevated lactic acid level upon presentation.  He was admitted to  A tele bed.   He rather quickly developed worse abdominal  Pain, gross rectal bleeding and sepsis syndrome with hypotension and tachycardia.  He was transferred to the stepdown unit.  Gastroenterology and Critical Care Medicine was consulted.   We attempted fluid resuscitation unsuccessfully. He was  A firm DNR status and the family desired no heroic measures.   He died on 09-25-13 within 24 hours of being admitted.  Consults: GI and CCM  Significant Diagnostic Studies: ct a of abdomen.  Treatments: iv fluid    Disposition:  Body released from the morgue.              SignedRodrigo Ran: Makell Cyr A 12/04/2013, 6:38 PM

## 2015-04-02 IMAGING — CT CT CTA ABD/PEL W/CM AND/OR W/O CM
2 of 3 series · 13 of 32 positions shown, 18 images · IV contrast (OMNIPAQUE 300)
Comparison: Chest radiographs 11/17/2013 and earlier.

CLINICAL DATA: [AGE] male with back pain, generalized
abdominal pain greatest in the left lower quadrant. Initial
encounter.

EXAM:
CT ANGIOGRAPHY ABDOMEN AND PELVIS WITH CONTRAST AND WITHOUT CONTRAST
TECHNIQUE: Multidetector CT imaging of the abdomen and pelvis was performed
using the standard protocol during bolus administration of
intravenous contrast. Multiplanar reconstructed images including
MIPs were obtained and reviewed to evaluate the vascular anatomy.
CONTRAST:  100mL OMNIPAQUE IOHEXOL 350 MG/ML SOLN

[Series 4: arterial 3.0 b30f · axial · arterial · 0.77mm/px · z∈[+1120,+1390]mm · 6 of 152 slices shown]
[im 11/152  soft-tissue]
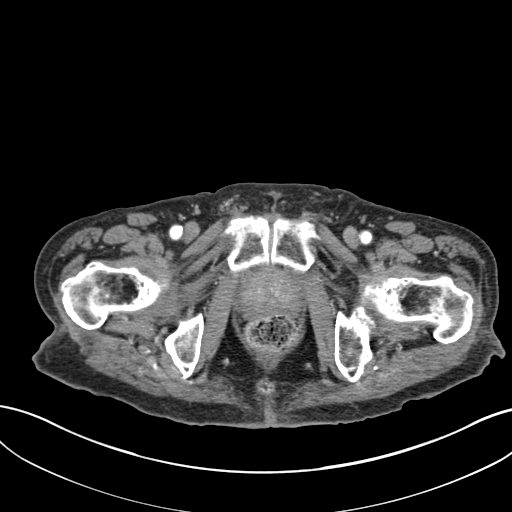
[im 31/152  soft-tissue]
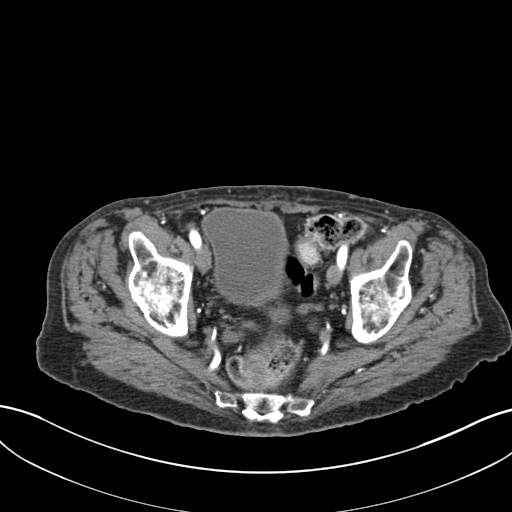
[im 51/152  soft-tissue]
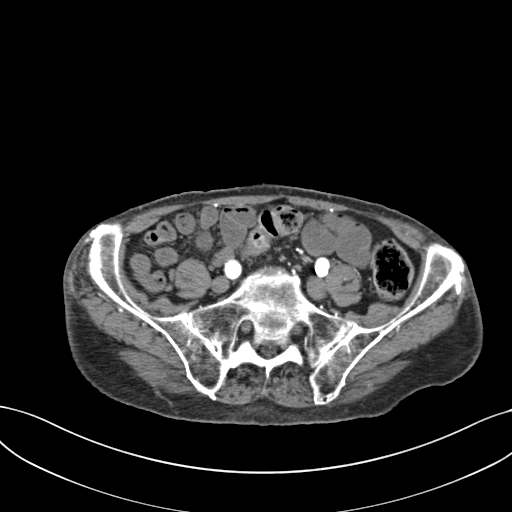
[im 71/152  soft-tissue]
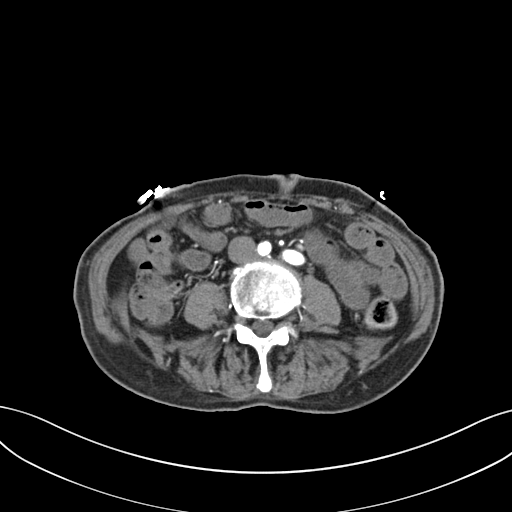
[im 81/152  soft-tissue]
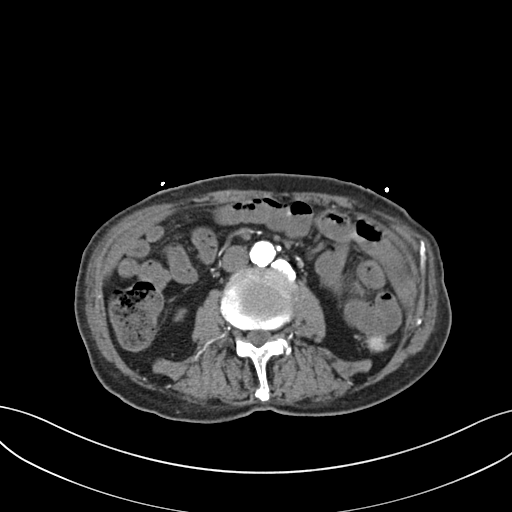
[im 101/152  soft-tissue]
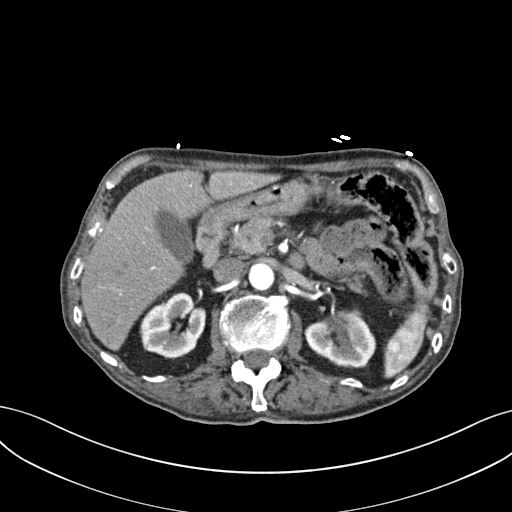

[Series 5: venous 5.0 b30f · axial · portal-venous · 0.77mm/px · z∈[+1118,+1428]mm · 7 of 84 slices shown, 12 images]
[im 11/84  soft-tissue]
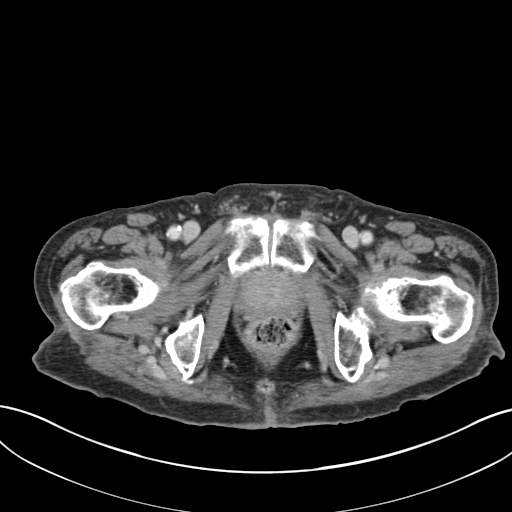
[im 11/84  bone]
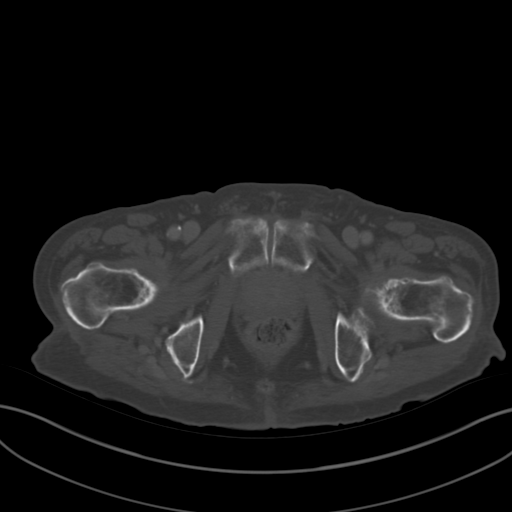
[im 21/84  soft-tissue]
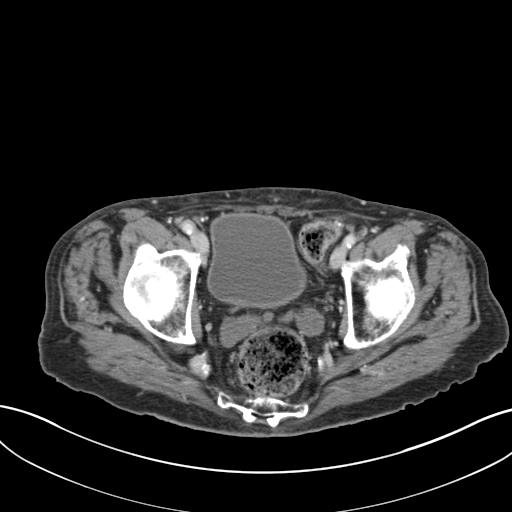
[im 32/84  soft-tissue]
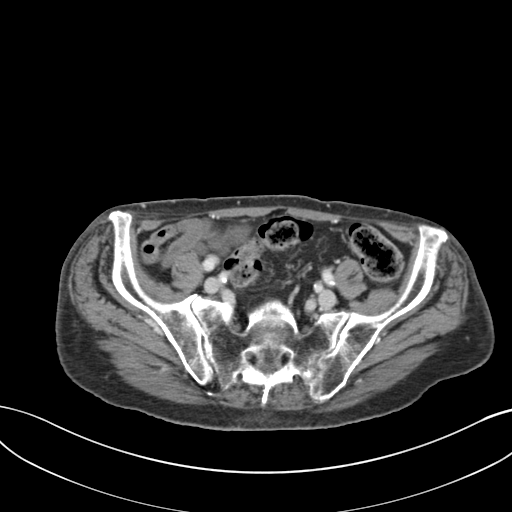
[im 42/84  soft-tissue]
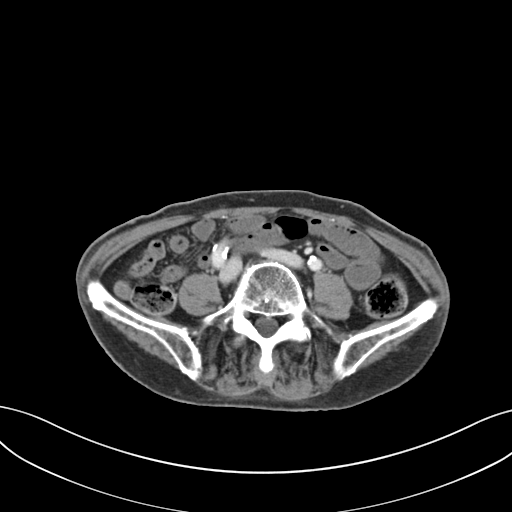
[im 42/84  lung]
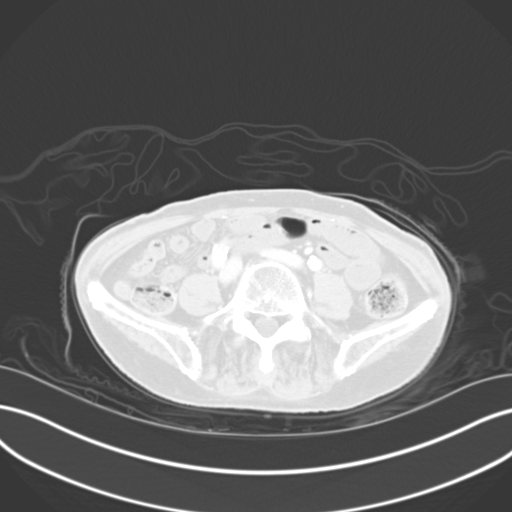
[im 52/84  soft-tissue]
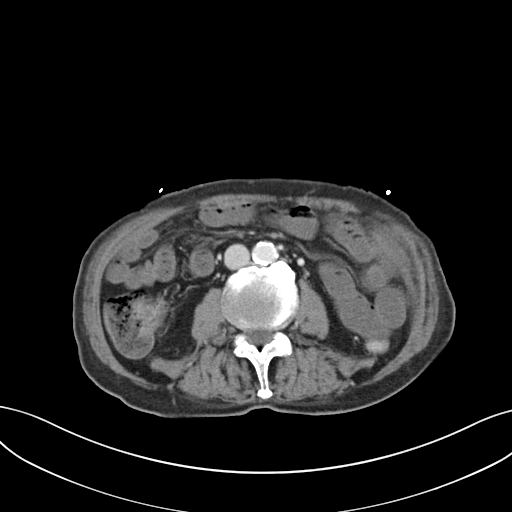
[im 52/84  lung]
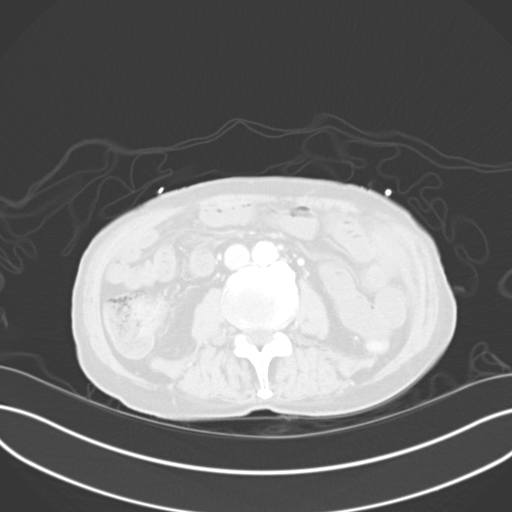
[im 63/84  soft-tissue]
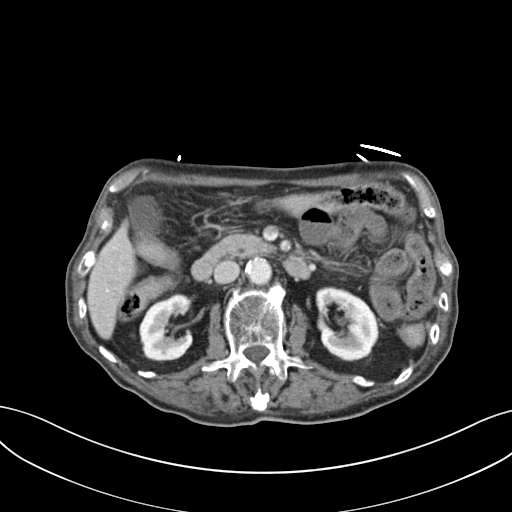
[im 63/84  lung]
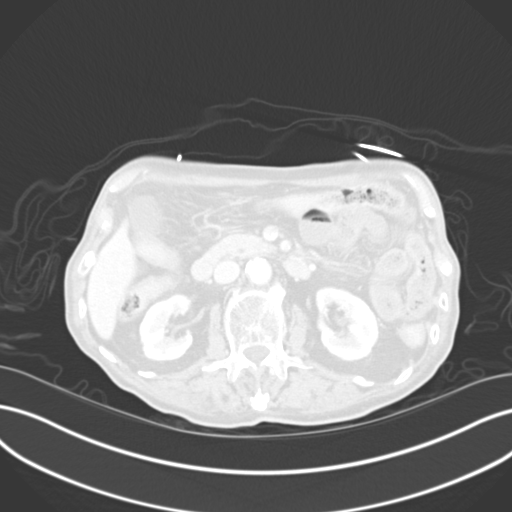
[im 73/84  soft-tissue]
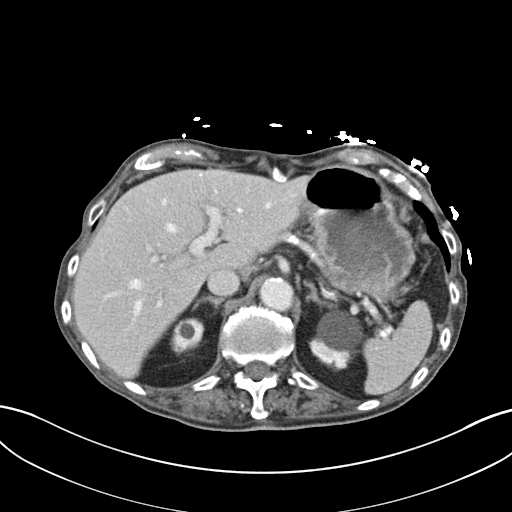
[im 73/84  lung]
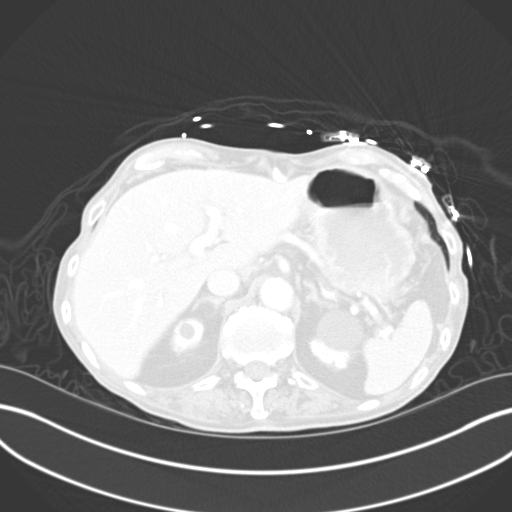

[13 of 32 positions shown; findings below may reference images not displayed]

FINDINGS: Cardiomegaly with biatrial enlargement. No pericardial or pleural
effusion. Negative lung bases.

Chronic appearing T12 superior endplate deformity, may a
degenerative Schmorl's node. Normal lumbar segmentation. No acute
osseous abnormality identified.

No pelvic free fluid. Stool ball in the rectum. Negative bladder.
Negative sigmoid and left colon. Largely decompressed more proximal
colon. Negative cecum and distal small bowel. No dilated small bowel
loops. Negative stomach and duodenum.

Liver, gallbladder, spleen, and adrenal glands are within normal
limits. Pancreas is normal except for several rounded hypodense
lesions at the tail, the largest 11 mm (series 5, image 15). The
head and uncinate process are normal.

Both kidneys are within normal limits for age; there is a left upper
pole 38 mm simple renal cyst. No abdominal free fluid. No
pneumoperitoneum. No focal mesenteric stranding. No lymphadenopathy.

VASCULAR FINDINGS:

Negative descending thoracic aorta except for mild atherosclerotic
plaque. Moderate plaque affecting the abdominal aorta. Major
abdominal aortic branches remain patent including the IMA. No
abdominal aortic aneurysm or dissection. Patent aortoiliac
bifurcation. Mild to moderate bilateral common iliac artery ectasia
and plaque. Bilateral internal and external iliac arteries remain
patent. Mild plaque affecting the external iliac arteries, common
femoral arteries, an the origins of the bilateral SFA and PFA are
patent.

Review of the MIP images confirms the above findings.
IMPRESSION: 1. Negative distal thoracic and abdominal aorta except for
atherosclerosis. Mild iliac artery ectasia.

2. No acute or inflammatory process identified in the abdomen or
pelvis.

3. Multiple small cystic lesions in the pancreatic tail, largest 11
mm, These very likely are benign/indolent and significance is
doubtful in this age group. If followup is desired consensus
guidelines recommend followup exam in 1 year, preferably pancreatic
protocol CT or MRI (Managing Incidental Findings on Abdominal CT:
White Paper of the ACR Incidental Findings Committee. [HOSPITAL] 0343;[DATE]).
